# Patient Record
Sex: Female | Born: 1980 | Race: Black or African American | Hispanic: No | Marital: Single | Smoking: Never smoker
Health system: Southern US, Community
[De-identification: ages and names within clinical notes are randomized; demographics above are authoritative.]

## PROBLEM LIST (undated history)

## (undated) ENCOUNTER — Ambulatory Visit (HOSPITAL_COMMUNITY)

## (undated) DIAGNOSIS — J302 Other seasonal allergic rhinitis: Secondary | ICD-10-CM

## (undated) DIAGNOSIS — L0291 Cutaneous abscess, unspecified: Secondary | ICD-10-CM

## (undated) DIAGNOSIS — F419 Anxiety disorder, unspecified: Secondary | ICD-10-CM

## (undated) HISTORY — PX: TUBAL LIGATION: SHX77

---
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
## 1998-01-19 ENCOUNTER — Emergency Department (HOSPITAL_COMMUNITY): Admission: EM | Admit: 1998-01-19 | Discharge: 1998-01-19 | Payer: Self-pay | Admitting: Emergency Medicine

## 1998-02-17 ENCOUNTER — Emergency Department (HOSPITAL_COMMUNITY): Admission: EM | Admit: 1998-02-17 | Discharge: 1998-02-17 | Payer: Self-pay | Admitting: Emergency Medicine

## 1998-02-18 ENCOUNTER — Encounter: Payer: Self-pay | Admitting: Emergency Medicine

## 2004-02-10 ENCOUNTER — Other Ambulatory Visit: Admission: RE | Admit: 2004-02-10 | Discharge: 2004-02-10 | Payer: Self-pay | Admitting: Gynecology

## 2004-04-29 ENCOUNTER — Emergency Department (HOSPITAL_COMMUNITY): Admission: EM | Admit: 2004-04-29 | Discharge: 2004-04-29 | Payer: Self-pay | Admitting: Family Medicine

## 2005-02-27 ENCOUNTER — Emergency Department (HOSPITAL_COMMUNITY): Admission: EM | Admit: 2005-02-27 | Discharge: 2005-02-27 | Payer: Self-pay | Admitting: Emergency Medicine

## 2006-01-12 ENCOUNTER — Emergency Department (HOSPITAL_COMMUNITY): Admission: EM | Admit: 2006-01-12 | Discharge: 2006-01-12 | Payer: Self-pay | Admitting: Family Medicine

## 2006-06-04 ENCOUNTER — Inpatient Hospital Stay (HOSPITAL_COMMUNITY): Admission: AD | Admit: 2006-06-04 | Discharge: 2006-06-04 | Payer: Self-pay | Admitting: *Deleted

## 2006-07-12 ENCOUNTER — Encounter: Admission: RE | Admit: 2006-07-12 | Discharge: 2006-07-12 | Payer: Self-pay | Admitting: Obstetrics and Gynecology

## 2006-09-18 ENCOUNTER — Inpatient Hospital Stay (HOSPITAL_COMMUNITY): Admission: AD | Admit: 2006-09-18 | Discharge: 2006-09-21 | Payer: Self-pay | Admitting: Obstetrics and Gynecology

## 2006-09-20 ENCOUNTER — Encounter (INDEPENDENT_AMBULATORY_CARE_PROVIDER_SITE_OTHER): Payer: Self-pay | Admitting: Obstetrics and Gynecology

## 2007-06-08 ENCOUNTER — Emergency Department (HOSPITAL_COMMUNITY): Admission: EM | Admit: 2007-06-08 | Discharge: 2007-06-08 | Payer: Self-pay | Admitting: Emergency Medicine

## 2007-06-12 ENCOUNTER — Emergency Department (HOSPITAL_COMMUNITY): Admission: EM | Admit: 2007-06-12 | Discharge: 2007-06-12 | Payer: Self-pay | Admitting: Emergency Medicine

## 2007-07-24 ENCOUNTER — Emergency Department (HOSPITAL_COMMUNITY): Admission: EM | Admit: 2007-07-24 | Discharge: 2007-07-24 | Payer: Self-pay | Admitting: Emergency Medicine

## 2007-10-23 ENCOUNTER — Emergency Department (HOSPITAL_COMMUNITY): Admission: EM | Admit: 2007-10-23 | Discharge: 2007-10-23 | Payer: Self-pay | Admitting: Emergency Medicine

## 2007-10-26 ENCOUNTER — Ambulatory Visit: Payer: Self-pay | Admitting: Family Medicine

## 2007-10-26 ENCOUNTER — Encounter (INDEPENDENT_AMBULATORY_CARE_PROVIDER_SITE_OTHER): Payer: Self-pay | Admitting: *Deleted

## 2007-10-26 ENCOUNTER — Encounter: Payer: Self-pay | Admitting: Family Medicine

## 2007-10-26 DIAGNOSIS — F329 Major depressive disorder, single episode, unspecified: Secondary | ICD-10-CM

## 2007-10-26 DIAGNOSIS — F32A Depression, unspecified: Secondary | ICD-10-CM | POA: Insufficient documentation

## 2007-10-26 LAB — CONVERTED CEMR LAB
CO2: 20 meq/L (ref 19–32)
Chloride: 106 meq/L (ref 96–112)
Creatinine, Ser: 0.91 mg/dL (ref 0.40–1.20)
HCT: 41.5 % (ref 36.0–46.0)
Hemoglobin: 12.7 g/dL (ref 12.0–15.0)
Platelets: 189 10*3/uL (ref 150–400)
Potassium: 3.9 meq/L (ref 3.5–5.3)
WBC: 4.4 10*3/uL (ref 4.0–10.5)

## 2008-02-22 ENCOUNTER — Emergency Department (HOSPITAL_COMMUNITY): Admission: EM | Admit: 2008-02-22 | Discharge: 2008-02-22 | Payer: Self-pay | Admitting: Emergency Medicine

## 2008-02-27 ENCOUNTER — Telehealth: Payer: Self-pay | Admitting: *Deleted

## 2008-02-27 ENCOUNTER — Ambulatory Visit: Payer: Self-pay | Admitting: Family Medicine

## 2008-04-14 ENCOUNTER — Emergency Department (HOSPITAL_COMMUNITY): Admission: EM | Admit: 2008-04-14 | Discharge: 2008-04-14 | Payer: Self-pay | Admitting: *Deleted

## 2008-04-15 ENCOUNTER — Ambulatory Visit: Payer: Self-pay | Admitting: Family Medicine

## 2008-06-06 ENCOUNTER — Telehealth: Payer: Self-pay | Admitting: *Deleted

## 2008-06-19 ENCOUNTER — Telehealth: Payer: Self-pay | Admitting: Family Medicine

## 2008-08-26 ENCOUNTER — Emergency Department (HOSPITAL_COMMUNITY): Admission: EM | Admit: 2008-08-26 | Discharge: 2008-08-26 | Payer: Self-pay | Admitting: Emergency Medicine

## 2008-12-18 ENCOUNTER — Telehealth: Payer: Self-pay | Admitting: Family Medicine

## 2008-12-18 ENCOUNTER — Ambulatory Visit: Payer: Self-pay | Admitting: Family Medicine

## 2008-12-30 ENCOUNTER — Telehealth: Payer: Self-pay | Admitting: Family Medicine

## 2009-03-22 ENCOUNTER — Emergency Department (HOSPITAL_COMMUNITY): Admission: EM | Admit: 2009-03-22 | Discharge: 2009-03-22 | Payer: Self-pay | Admitting: Emergency Medicine

## 2009-04-03 ENCOUNTER — Emergency Department (HOSPITAL_COMMUNITY): Admission: EM | Admit: 2009-04-03 | Discharge: 2009-04-03 | Payer: Self-pay | Admitting: Family Medicine

## 2009-06-10 ENCOUNTER — Telehealth: Payer: Self-pay | Admitting: Family Medicine

## 2009-06-23 ENCOUNTER — Ambulatory Visit: Payer: Self-pay | Admitting: Family Medicine

## 2009-06-23 LAB — CONVERTED CEMR LAB
Alkaline Phosphatase: 48 units/L (ref 39–117)
BUN: 6 mg/dL (ref 6–23)
CO2: 23 meq/L (ref 19–32)
Creatinine, Ser: 0.67 mg/dL (ref 0.40–1.20)
Glucose, Bld: 127 mg/dL — ABNORMAL HIGH (ref 70–99)
HCT: 38.9 % (ref 36.0–46.0)
MCHC: 30.8 g/dL (ref 30.0–36.0)
MCV: 74.5 fL — ABNORMAL LOW (ref 78.0–100.0)
RBC: 5.22 M/uL — ABNORMAL HIGH (ref 3.87–5.11)
Sodium: 139 meq/L (ref 135–145)
TSH: 0.502 microintl units/mL (ref 0.350–4.500)
Total Bilirubin: 0.6 mg/dL (ref 0.3–1.2)
Total Protein: 6.7 g/dL (ref 6.0–8.3)

## 2009-07-03 ENCOUNTER — Encounter: Payer: Self-pay | Admitting: Family Medicine

## 2010-03-03 NOTE — Progress Notes (Signed)
Summary: phn msg  Phone Note Call from Patient Call back at [facility] Phone [phone]   Caller: Patient Summary of Call: Wants to talk to Dr. about antidepressants. Initial call taken by: Clydell Hakim,  Jun 10, 2009 3:23 PM  Follow-up for Phone Call        Offer her an appointment can see tomorrow moring or next week thanks [facility] Follow-up by: Pearlean Brownie MD,  Jun 11, 2009 8:53 AM  Additional Follow-up for Phone Call Additional follow up Details #1::        Attempted to call but no answer. Additional Follow-up by: Jone Baseman CMA,  Jun 11, 2009 9:29 AM    Additional Follow-up for Phone Call Additional follow up Details #2::    Pls try to contact her again thanks [facility] Follow-up by: Pearlean Brownie MD,  Jun 13, 2009 8:48 AM  Additional Follow-up for Phone Call Additional follow up Details #3:: Details for Additional Follow-up Action Taken: Attempted to call again.  No answer.  Will await callback Additional Follow-up by: Jone Baseman CMA,  Jun 13, 2009 11:21 AM

## 2010-03-03 NOTE — Miscellaneous (Signed)
  Clinical Lists Changes  Called [phone] - message not available - not able to leave message Pearlean Brownie MD  July 03, 2009 3:10 PM  pt returned call and cannot take calls at work.  she gets off at 4:30 so pls call back after that time. De Nurse  July 04, 2009 3:10 PM  Called Talked with her - is seeing counselor and feeling some better and taking her medications without problems.  Asked for her to come in in a few weeks or if worsening Pearlean Brownie MD  July 04, 2009 5:11 PM

## 2010-03-03 NOTE — Assessment & Plan Note (Signed)
Summary: meds/kh   Vital Signs:  Patient profile:   30 year old female Height:      64 inches Weight:      202.8 pounds BMI:     34.94 Temp:     99.0 degrees F oral Pulse rate:   77 / minute BP sitting:   112 / 79  (left arm) Cuff size:   regular  Vitals Entered By: Garen Grams LPN (Jun 23, 2009 10:35 AM) CC: f/u meds Is Patient Diabetic? No Pain Assessment Patient in pain? no        Primary Care Provider:  Pearlean Brownie MD  CC:  f/u meds.  History of Present Illness: DEPRESSION (ICD-311) feeling very down for the last few months.  Many stressors particularly with her sons fecal incontinence and taking care of her sisters child. Missing work to care for son and having difficulty concentrating.  Crying frequently.  Not much appetite and disordered sleep.  No suicidal ideation or plans but does not feel like she is living.  Not on sertraline for months since did not feel it helped.  No weight loss or fever or focal pain other than migraine headaches Has made an appointment to see a counselor this coming Thursday Does smoke marijuana intermittently no alcohol  Father had history of depression  ROS - as above PMH - Medications reviewed and updated in medication list.  Smoking Status noted in VS form      Habits & Providers  Alcohol-Tobacco-Diet     Tobacco Status: never  Current Medications (verified): 1)  Paxil 20 Mg Tabs (Paroxetine Hcl) .Marland Kitchen.. 1 By Mouth Daily  Social History: Raj Janus daughter 76; Derrick son 67; Tyleek son 68, Antinaysha daughter 37;  Also caring for Niece  Works as Clinical biochemist rep at [employer] No hobbies  Physical Exam  General:  Frequent crying.  Attentive and appropriate with her son Ladene Artist Psych:  Cognition and judgment appear intact. Alert and cooperative with normal attention span and concentration. No apparent delusions, illusions, hallucinations   Impression & Recommendations:  Problem # 1:  DEPRESSION (ICD-311) Assessment  Deteriorated Most likely depression but with significant fatigue will check labs. Is in a difficult situation with children.  She has made an appointment to see a counselor.  Will start Paxil and follow closely  The following medications were removed from the medication list:    Sertraline Hcl 50 Mg Tabs (Sertraline hcl) .Marland Kitchen... 1 by mouth daily Her updated medication list for this problem includes:    Paxil 20 Mg Tabs (Paroxetine hcl) .Marland Kitchen... 1 by mouth daily  Orders: Comp Met-[facility] [phone]) TSH-[facility] (09811-91478) CBC-[facility] (29562) [facility]- Est  Level 4 (13086)  Complete Medication List: 1)  Paxil 20 Mg Tabs (Paroxetine hcl) .Marland Kitchen.. 1 by mouth daily  Patient Instructions: 1)  Please schedule a follow-up appointment in 1-2 weeks.  2)  Keep your appointment with your counselor 3)  Call if you are feeling worse 4)  I will call you if your lab is abnormal otherwise I will discuss the results during our next visit 5)  Call me if your work needs information or paper work Prescriptions: PAXIL 20 MG TABS (PAROXETINE HCL) 1 by mouth daily  #30 x 1   Entered and Authorized by:   Pearlean Brownie MD   Signed by:   Pearlean Brownie MD on 06/23/2009   Method used:   Electronically to        [facility]  [address] [phone]* (retail)  [address]       [place], [place]  [id number]       Ph: [phone] or [phone]       Fax: [phone]   RxID:   [id number]    Orders Added: 1)  Comp Met-[facility] [16606-30160] 2)  TSH-[facility] [[id number]] 3)  CBC-[facility] [85027] 4)  [facility]- Est  Level 4 [22025]

## 2010-03-05 ENCOUNTER — Encounter: Payer: Self-pay | Admitting: *Deleted

## 2010-04-07 ENCOUNTER — Emergency Department (HOSPITAL_COMMUNITY)
Admission: EM | Admit: 2010-04-07 | Discharge: 2010-04-07 | Disposition: A | Discharge status: Home / Self Care | Payer: BC Managed Care – PPO | Attending: Emergency Medicine | Admitting: Emergency Medicine

## 2010-04-07 DIAGNOSIS — K5289 Other specified noninfective gastroenteritis and colitis: Secondary | ICD-10-CM | POA: Insufficient documentation

## 2010-04-07 DIAGNOSIS — R112 Nausea with vomiting, unspecified: Secondary | ICD-10-CM | POA: Insufficient documentation

## 2010-04-07 DIAGNOSIS — R197 Diarrhea, unspecified: Secondary | ICD-10-CM | POA: Insufficient documentation

## 2010-04-26 LAB — POCT URINALYSIS DIP (DEVICE)
Bilirubin Urine: NEGATIVE
Glucose, UA: NEGATIVE mg/dL
Hgb urine dipstick: NEGATIVE
Ketones, ur: NEGATIVE mg/dL
Specific Gravity, Urine: 1.015 (ref 1.005–1.030)

## 2010-04-26 LAB — POCT PREGNANCY, URINE: Preg Test, Ur: NEGATIVE

## 2010-04-26 LAB — GC/CHLAMYDIA PROBE AMP, GENITAL
Chlamydia, DNA Probe: NEGATIVE
GC Probe Amp, Genital: NEGATIVE

## 2010-05-10 LAB — POCT URINALYSIS DIP (DEVICE)
Nitrite: POSITIVE — AB
pH: 7 (ref 5.0–8.0)

## 2010-05-25 ENCOUNTER — Ambulatory Visit (INDEPENDENT_AMBULATORY_CARE_PROVIDER_SITE_OTHER): Payer: BC Managed Care – PPO | Admitting: Family Medicine

## 2010-05-25 ENCOUNTER — Other Ambulatory Visit (HOSPITAL_COMMUNITY)
Admission: RE | Admit: 2010-05-25 | Discharge: 2010-05-25 | Disposition: A | Discharge status: Home / Self Care | Payer: BC Managed Care – PPO | Source: Ambulatory Visit | Attending: Family Medicine | Admitting: Family Medicine

## 2010-05-25 ENCOUNTER — Encounter: Payer: Self-pay | Admitting: Family Medicine

## 2010-05-25 VITALS — BP 150/81 | HR 105 | Temp 98.8°F | Ht 65.0 in | Wt 204.6 lb

## 2010-05-25 DIAGNOSIS — F329 Major depressive disorder, single episode, unspecified: Secondary | ICD-10-CM

## 2010-05-25 DIAGNOSIS — N949 Unspecified condition associated with female genital organs and menstrual cycle: Secondary | ICD-10-CM

## 2010-05-25 DIAGNOSIS — Z13220 Encounter for screening for lipoid disorders: Secondary | ICD-10-CM

## 2010-05-25 DIAGNOSIS — N644 Mastodynia: Secondary | ICD-10-CM | POA: Insufficient documentation

## 2010-05-25 DIAGNOSIS — R102 Pelvic and perineal pain: Secondary | ICD-10-CM

## 2010-05-25 DIAGNOSIS — F3289 Other specified depressive episodes: Secondary | ICD-10-CM

## 2010-05-25 DIAGNOSIS — Z124 Encounter for screening for malignant neoplasm of cervix: Secondary | ICD-10-CM

## 2010-05-25 DIAGNOSIS — Z01419 Encounter for gynecological examination (general) (routine) without abnormal findings: Secondary | ICD-10-CM | POA: Insufficient documentation

## 2010-05-25 LAB — POCT URINALYSIS DIPSTICK
Bilirubin, UA: NEGATIVE
Glucose, UA: NEGATIVE
Leukocytes, UA: NEGATIVE
Nitrite, UA: NEGATIVE
Urobilinogen, UA: 1
pH, UA: 6

## 2010-05-25 LAB — POCT UA - MICROSCOPIC ONLY

## 2010-05-25 NOTE — Patient Instructions (Addendum)
Keep up the exercise that is great. Controlling your weight is your major goal.  Review your diet to see what high calorie foods you eat or drink  Come in fasting - no food or drink for 8 hours and we will check your labs and cholesterol I will call you if your tests are not good.  Otherwise I will send you a letter.  If you do not hear from me with in 2 weeks please call our office.

## 2010-05-25 NOTE — Assessment & Plan Note (Signed)
Under control off of medication

## 2010-05-25 NOTE — Assessment & Plan Note (Signed)
Associated with diffuse pelvic pain and back pain.  Exam and UA and Upreg was normal today.  Given her weight gain and nonspecific symptoms and strong family history for diabetes mellitus will check bmet and tsh

## 2010-05-25 NOTE — Progress Notes (Signed)
  Subjective:     Sara Brock is a 30 y.o. female and is here for a comprehensive physical exam. The patient reports problems - Feeling achy in both breast and pelvic discomfort for last few months.  Also intermittent left sided low back pain.   Review of Systems Patient reports no vision/ hearing  changes, adenopathy,fever, weight change,  persistant / recurrent hoarseness , swallowing issues, chest pain,palpitations,edema,persistant /recurrent cough, hemoptysis, dyspnea( rest/ exertional/paroxysmal nocturnal), gastrointestinal bleeding(melena, rectal bleeding), abdominal pain, significant heartburn boel changes,GU symptoms(dysuria, hematuria,pyuria, incontinence) ), Gyn symptoms(abnormal  bleeding , pain),  syncope, focal weakness, memory loss,numbness & tingling, skin/hair /nail changes,abnormal bruising or bleeding, anxiety,or depression.   Objective:    Neck:  No deformities, thyromegaly, masses, or tenderness noted.   Supple with full range of motion without pain. Heart - Regular rate and rhythm.  No murmurs, gallops or rubs.    Lungs:  Normal respiratory effort, chest expands symmetrically. Lungs are clear to auscultation, no crackles or wheezes. Abdomen: soft and non-tender without masses, organomegaly or hernias noted.  No guarding or rebound Breasts: breasts appear normal, no suspicious masses, no skin or nipple changes or axillary nodes Genitalia:  Normal introitus for age, no external lesions, no vaginal discharge, mucosa pink and moist, no vaginal or cervical lesions, no vaginal atrophy, no friaility or hemorrhage, normal uterus size and position, no adnexal masses or tenderness Back - nontender no deformity  UA noted   Assessment:    Healthy female exam.    Plan:     See After Visit Summary for Counseling Recommendations

## 2010-06-07 ENCOUNTER — Emergency Department (HOSPITAL_COMMUNITY)
Admission: EM | Admit: 2010-06-07 | Discharge: 2010-06-07 | Disposition: A | Discharge status: Home / Self Care | Payer: BC Managed Care – PPO | Attending: Emergency Medicine | Admitting: Emergency Medicine

## 2010-06-07 ENCOUNTER — Emergency Department (HOSPITAL_COMMUNITY): Payer: BC Managed Care – PPO

## 2010-06-07 DIAGNOSIS — R296 Repeated falls: Secondary | ICD-10-CM | POA: Insufficient documentation

## 2010-06-07 DIAGNOSIS — M25569 Pain in unspecified knee: Secondary | ICD-10-CM | POA: Insufficient documentation

## 2010-06-07 DIAGNOSIS — S8000XA Contusion of unspecified knee, initial encounter: Secondary | ICD-10-CM | POA: Insufficient documentation

## 2010-06-19 NOTE — Op Note (Signed)
NAMEKYLIANA, Sara Brock           ACCOUNT NO.:  [id number]   MEDICAL RECORD NO.:  [id number]          PATIENT TYPE:  INP   LOCATION:  9125                          FACILITY:  [facility]   PHYSICIAN:  Maxie Better, M.D.DATE OF BIRTH:  01-30-1981   DATE OF PROCEDURE:  09/20/2006  DATE OF DISCHARGE:                               OPERATIVE REPORT   PREOPERATIVE DIAGNOSIS:  Desires sterilization, status post vaginal  delivery on September 19, 2006, and multiparity.   PROCEDURE:  Modified P[place]oy postpartum tubal ligation.   POSTOPERATIVE DIAGNOSIS:  Desires sterilization, multiparity, status  post vaginal delivery on September 19, 2006.   ANESTHESIA:  General.   SURGEON:  Maxie Better, M.D.   INDICATION:  25-year gravida 4, para 4 female status post uncomplicated  vaginal delivery on September 19, 2006, desires permanent sterilization.  Risk and benefit of this procedure explained to the patient.  Consent  was signed and the patient was transferred to the operating room.   PROCEDURE:  Under adequate general anesthesia the patient is placed in  the supine position.  She was sterilely prepped and draped in usual  fashion.  Quarter percent Marcaine was injected over the infraumbilical  and incision was then made, carried down to the rectus fascia.  Rectus  fascia was opened transversely and the parietoperitoneum was also  opened.  Pursestring 0 Vicryl was placed along fascia.  The fallopian  tube was bilaterally identified down to the fimbriated end after much  manipulation due to the omentum present.  The midportion of both  fallopian tube was grasped with a Babcock.  The underlying mesosalpinx  was opened with cautery. Proximal and distal portion of both tube was  then tied with 0 chromic suture x2 proximally and distally and the  intervening segment of tube was removed bilaterally.  The good  hemostasis noted.  The pursestring was then closed the fascia.  The skin  was approximated  using subcuticular 4-0 Vicryl suture.  Specimen labeled  portion of right left fallopian tube was sent to pathology.  Estimated  blood loss was minimal.  Complication was none.  The patient tolerated  the procedure well and was transferred from in stable condition.      Maxie Better, M.D.  Electronically Signed     [place]/MEDQ  D:  09/20/2006  T:  09/20/2006  Job:  [id number]

## 2010-11-13 LAB — CBC
HCT: 30.1 — ABNORMAL LOW
Hemoglobin: 10.1 — ABNORMAL LOW
Hemoglobin: 9.1 — ABNORMAL LOW
MCHC: 32.9
MCV: 72.6 — ABNORMAL LOW
MCV: 73.7 — ABNORMAL LOW
RBC: 3.75 — ABNORMAL LOW
RDW: 16.1 — ABNORMAL HIGH
WBC: 7.9

## 2011-06-22 ENCOUNTER — Emergency Department (HOSPITAL_COMMUNITY)
Admission: EM | Admit: 2011-06-22 | Discharge: 2011-06-23 | Disposition: A | Discharge status: Home / Self Care | Payer: Medicaid Other | Attending: Emergency Medicine | Admitting: Emergency Medicine

## 2011-06-22 ENCOUNTER — Emergency Department (HOSPITAL_COMMUNITY): Payer: Medicaid Other

## 2011-06-22 ENCOUNTER — Encounter (HOSPITAL_COMMUNITY): Payer: Self-pay | Admitting: Emergency Medicine

## 2011-06-22 DIAGNOSIS — R6889 Other general symptoms and signs: Secondary | ICD-10-CM | POA: Insufficient documentation

## 2011-06-22 DIAGNOSIS — R05 Cough: Secondary | ICD-10-CM | POA: Insufficient documentation

## 2011-06-22 DIAGNOSIS — R0602 Shortness of breath: Secondary | ICD-10-CM | POA: Insufficient documentation

## 2011-06-22 DIAGNOSIS — J069 Acute upper respiratory infection, unspecified: Secondary | ICD-10-CM

## 2011-06-22 DIAGNOSIS — J3489 Other specified disorders of nose and nasal sinuses: Secondary | ICD-10-CM | POA: Insufficient documentation

## 2011-06-22 DIAGNOSIS — J029 Acute pharyngitis, unspecified: Secondary | ICD-10-CM

## 2011-06-22 DIAGNOSIS — R059 Cough, unspecified: Secondary | ICD-10-CM | POA: Insufficient documentation

## 2011-06-22 MED ORDER — PENICILLIN G BENZATHINE 1200000 UNIT/2ML IM SUSP
1.2000 10*6.[IU] | Freq: Once | INTRAMUSCULAR | Status: AC
  Administered 2011-06-23: 1.2 10*6.[IU] via INTRAMUSCULAR
  Filled 2011-06-22: qty 2

## 2011-06-22 MED ORDER — ALBUTEROL SULFATE (5 MG/ML) 0.5% IN NEBU
5.0000 mg | INHALATION_SOLUTION | Freq: Once | RESPIRATORY_TRACT | Status: AC
  Administered 2011-06-22: 5 mg via RESPIRATORY_TRACT

## 2011-06-22 MED ORDER — ALBUTEROL SULFATE (5 MG/ML) 0.5% IN NEBU
INHALATION_SOLUTION | RESPIRATORY_TRACT | Status: AC
  Filled 2011-06-22: qty 1

## 2011-06-22 NOTE — ED Provider Notes (Signed)
History     CSN: [id number]  Arrival date & time 06/22/11  2032   First MD Initiated Contact with Patient 06/22/11 2335      Chief Complaint  Patient presents with  . Shortness of Breath    (Consider location/radiation/quality/duration/timing/severity/associated sxs/prior treatment) HPI Comments: Patient is an emergency department with chief complaint of sore throat, runny nose, and shortness of breath.  Onset of symptoms began 2 days ago.  Patient denies any fevers, night sweats, chills, trismus, vision change, disequilibrium, chest tightness, chest pain, claudication orthopnea, dyspnea on exertion, history of asthma, or sick contacts.  Patient has not taken anything for her nasal congestion.  Patient is a 31 y.o. female presenting with shortness of breath. The history is provided by the patient.  Shortness of Breath  Associated symptoms include rhinorrhea, sore throat, cough and shortness of breath. Pertinent negatives include no chest pain, no fever, no stridor and no wheezing.    History reviewed. No pertinent past medical history.  Past Surgical History  Procedure Date  . Tubal ligation     No family history on file.  History  Substance Use Topics  . Smoking status: Never Smoker   . Smokeless tobacco: Not on file  . Alcohol Use: No    OB History    Grav Para Term Preterm Abortions TAB SAB Ect Mult Living                  Review of Systems  Constitutional: Negative for fever, chills, appetite change and fatigue.  HENT: Positive for congestion, sore throat and rhinorrhea. Negative for hearing loss, ear pain, nosebleeds, sneezing, trouble swallowing, neck stiffness, voice change, postnasal drip, sinus pressure, tinnitus and ear discharge.   Eyes: Negative for photophobia and visual disturbance.  Respiratory: Positive for cough and shortness of breath. Negative for apnea, choking, chest tightness, wheezing and stridor.   Cardiovascular: Negative for chest pain,  palpitations and leg swelling.  Gastrointestinal: Negative for nausea, vomiting, abdominal pain, diarrhea and constipation.  Genitourinary: Negative for dysuria, urgency and flank pain.  Musculoskeletal: Negative for myalgias.  Neurological: Negative for dizziness, seizures, syncope, weakness, light-headedness, numbness and headaches.  Psychiatric/Behavioral: Negative for behavioral problems and confusion.  All other systems reviewed and are negative.    Allergies  Review of patient's allergies indicates no known allergies.  Home Medications   Current Outpatient Rx  Name Route Sig Dispense Refill  . ALKA-SELTZER PLS SINUS & COUGH PO Oral Take 2 capsules by mouth daily.    Marland Kitchen NAPROXEN SODIUM 220 MG PO TABS Oral Take 220 mg by mouth daily as needed. For pain.      BP 122/79  Pulse 86  Resp 18  SpO2 98%  LMP 06/17/2011  Physical Exam  Nursing note and vitals reviewed. Constitutional: She is oriented to person, place, and time. She appears well-developed and well-nourished. No distress.  HENT:  Head: Normocephalic and atraumatic. No trismus in the jaw.  Right Ear: Tympanic membrane, external ear and ear canal normal.  Left Ear: Tympanic membrane, external ear and ear canal normal.  Nose: Nose normal. No rhinorrhea. Right sinus exhibits no maxillary sinus tenderness and no frontal sinus tenderness. Left sinus exhibits no maxillary sinus tenderness and no frontal sinus tenderness.  Mouth/Throat: Uvula is midline and mucous membranes are normal. Normal dentition. No dental abscesses or uvula swelling. Oropharyngeal exudate and posterior oropharyngeal edema present. No posterior oropharyngeal erythema or tonsillar abscesses.       No submental edema, tongue not  elevated, no trismus. No impending airway obstruction; Pt able to speak full sentences, swallow intact, no drooling, stridor, or tonsillar/uvula displacement. No palatal petechia  Eyes: Conjunctivae are normal.  Neck: Trachea  normal, normal range of motion and full passive range of motion without pain. Neck supple. No rigidity. Erythema present. Normal range of motion present. No Brudzinski's sign noted.       Flexion and extension of neck without pain or difficulty. Able to breath without difficulty in extension.  Cardiovascular: Normal rate and regular rhythm.   Pulmonary/Chest: Effort normal and breath sounds normal. No stridor. No respiratory distress. She has no wheezes.  Abdominal: Soft. There is no tenderness.       No obvious evidence of splenomegaly. Non ttp.   Musculoskeletal: Normal range of motion.  Lymphadenopathy:       Head (right side): No preauricular and no posterior auricular adenopathy present.       Head (left side): No preauricular and no posterior auricular adenopathy present.    She has cervical adenopathy.  Neurological: She is alert and oriented to person, place, and time.  Skin: Skin is warm and dry. No rash noted. She is not diaphoretic.  Psychiatric: She has a normal mood and affect.    ED Course  Procedures (including critical care time)  Labs Reviewed - No data to display Dg Chest 2 View  06/22/2011  *RADIOLOGY REPORT*  Clinical Data: Cough.  Mid chest pain.  Headache.  CHEST - 2 VIEW  Comparison: Two-view chest x-ray 02/22/2008, 06/12/1007.  Findings: Cardiomediastinal silhouette unremarkable, unchanged. Lungs clear.  Bronchovascular markings normal.  Pulmonary vascularity normal.  No pneumothorax.  No pleural effusions. Visualized bony thorax intact.  No significant interval change.  IMPRESSION: Normal and stable examination.  Original Report Authenticated By: Arnell Sieving, M.D.     No diagnosis found.    MDM  Pharyngitis, question strep vs URI   Pt CXR negative for acute infiltrate. Patients symptoms are consistent with URI, likely viral etiology. Discussed that antibiotics are not indicated for viral infections. Treated in ED w PCN shot for possible strep. No  evidence of infection spread, uvula midlinePt will be discharged with symptomatic treatment.  Verbalizes understanding and is agreeable with plan. Pt is hemodynamically stable & in NAD prior to dc.         Jaci Carrel, [place] 06/23/11 0004

## 2011-06-22 NOTE — ED Notes (Signed)
Presents with SOB that began today after sore throat and nasal congestion that began Friday. Left breath sounds with expiratory wheezes, c/o pain with inspiration. Sats 95% RA

## 2011-06-22 NOTE — ED Notes (Signed)
PT. REPORTS SOB WITH DRY COUGH , RUNNY NOSE /NASAL CONGESTION AND HEADACHE/SORE THROAT ONSET 2 DAYS AGO.  DENIES FEVER OR CHILLS.

## 2011-06-23 ENCOUNTER — Encounter (HOSPITAL_COMMUNITY): Payer: Self-pay | Admitting: *Deleted

## 2011-06-23 ENCOUNTER — Emergency Department (HOSPITAL_COMMUNITY)
Admission: EM | Admit: 2011-06-23 | Discharge: 2011-06-23 | Disposition: A | Discharge status: Home / Self Care | Payer: Medicaid Other | Source: Home / Self Care | Attending: Family Medicine | Admitting: Family Medicine

## 2011-06-23 DIAGNOSIS — J45901 Unspecified asthma with (acute) exacerbation: Secondary | ICD-10-CM

## 2011-06-23 MED ORDER — METHYLPREDNISOLONE 4 MG PO KIT: PACK | ORAL | Status: AC

## 2011-06-23 MED ORDER — ALBUTEROL SULFATE HFA 108 (90 BASE) MCG/ACT IN AERS: 1.0000 | INHALATION_SPRAY | Freq: Four times a day (QID) | RESPIRATORY_TRACT | Status: DC | PRN

## 2011-06-23 MED ORDER — METHYLPREDNISOLONE SODIUM SUCC 125 MG IJ SOLR
INTRAMUSCULAR | Status: AC
  Filled 2011-06-23: qty 2

## 2011-06-23 MED ORDER — ALBUTEROL SULFATE (5 MG/ML) 0.5% IN NEBU
INHALATION_SOLUTION | RESPIRATORY_TRACT | Status: AC
  Filled 2011-06-23: qty 1

## 2011-06-23 MED ORDER — METHYLPREDNISOLONE SODIUM SUCC 125 MG IJ SOLR
125.0000 mg | Freq: Once | INTRAMUSCULAR | Status: AC
  Administered 2011-06-23: 125 mg via INTRAMUSCULAR

## 2011-06-23 MED ORDER — MOMETASONE FUROATE 50 MCG/ACT NA SUSP: 2.0000 | Freq: Every day | NASAL | Status: DC

## 2011-06-23 MED ORDER — IPRATROPIUM BROMIDE 0.02 % IN SOLN
0.5000 mg | Freq: Once | RESPIRATORY_TRACT | Status: AC
  Administered 2011-06-23: 0.5 mg via RESPIRATORY_TRACT

## 2011-06-23 MED ORDER — IBUPROFEN 200 MG PO TABS
600.0000 mg | ORAL_TABLET | Freq: Once | ORAL | Status: AC
  Administered 2011-06-23: 600 mg via ORAL
  Filled 2011-06-23: qty 3

## 2011-06-23 MED ORDER — ALBUTEROL SULFATE (5 MG/ML) 0.5% IN NEBU
5.0000 mg | INHALATION_SOLUTION | Freq: Once | RESPIRATORY_TRACT | Status: AC
  Administered 2011-06-23: 5 mg via RESPIRATORY_TRACT

## 2011-06-23 NOTE — ED Notes (Signed)
Pt   Was  Seen    Er  Last  Pm  And  Was  Given    Penicillin  Injection  And        A  Breathing  tx       She  Reports  She  Was  Told  She  Had  URI         SHE  REPORTS  HER  SYMPTOMS       CONTINUE  SHE  HAS  SHORTNESS OF  BREATH AND  PAIN IN  CHEST     WHEN  SHE        SHE  MOVES   AND  EXERTS  HERSELF   SHE IS  AWAKE  AND  ALERT   SOMEWHAT  ANXIOUS

## 2011-06-23 NOTE — ED Provider Notes (Signed)
Medical screening examination/treatment/procedure(s) were performed by non-physician practitioner and as supervising physician I was immediately available for consultation/collaboration.  Ethelda Chick, MD 06/23/11 [phone]

## 2011-06-23 NOTE — ED Notes (Signed)
Called  To  Waiting  Room  To  evaul  31  Yr  Old  -  Seen er  Last  Pm  For  Glenford Peers  /throat  Infection   Pt  Congested  With stuffy  Nose   Pt  Reports  She  Had  Chest pain last pm and  Continues  Today   BP 124/86  Pulse  Ox  97  Pulse  102  resps  20

## 2011-06-23 NOTE — ED Provider Notes (Signed)
History     CSN: [id number]  Arrival date & time 06/23/11  1731   First MD Initiated Contact with Patient 06/23/11 1732      Chief Complaint  Patient presents with  . Shortness of Breath    (Consider location/radiation/quality/duration/timing/severity/associated sxs/prior treatment) Patient is a 31 y.o. female presenting with shortness of breath. The history is provided by the patient.  Shortness of Breath  The current episode started 3 to 5 days ago (seen in ER last eve, given pcn ). The onset was gradual. The problem has been gradually worsening (sx worse today with sob and doe.). The problem is moderate. Associated symptoms include cough and shortness of breath.    History reviewed. No pertinent past medical history.  Past Surgical History  Procedure Date  . Tubal ligation     History reviewed. No pertinent family history.  History  Substance Use Topics  . Smoking status: Never Smoker   . Smokeless tobacco: Not on file  . Alcohol Use: No    OB History    Grav Para Term Preterm Abortions TAB SAB Ect Mult Living                  Review of Systems  Constitutional: Negative.   HENT: Negative.   Respiratory: Positive for cough and shortness of breath.   Cardiovascular: Negative.   Gastrointestinal: Negative.   Skin: Negative.     Allergies  Review of patient's allergies indicates no known allergies.  Home Medications   Current Outpatient Rx  Name Route Sig Dispense Refill  . ALBUTEROL SULFATE HFA 108 (90 BASE) MCG/ACT IN AERS Inhalation Inhale 1-2 puffs into the lungs every 6 (six) hours as needed for wheezing. 1 Inhaler 0  . ALKA-SELTZER PLS SINUS & COUGH PO Oral Take 2 capsules by mouth daily.    . METHYLPREDNISOLONE 4 MG PO KIT  follow package directions, start on thurs until finished 21 tablet 0  . MOMETASONE FUROATE 50 MCG/ACT NA SUSP Nasal Place 2 sprays into the nose daily. 17 g 12  . NAPROXEN SODIUM 220 MG PO TABS Oral Take 220 mg by mouth daily as  needed. For pain.      BP 127/80  Pulse 96  Temp(Src) 98.4 F (36.9 C) (Oral)  Resp 20  SpO2 100%  LMP 06/17/2011  Physical Exam  Nursing note and vitals reviewed. Constitutional: She appears well-developed and well-nourished.  HENT:  Head: Normocephalic.  Right Ear: External ear normal.  Left Ear: External ear normal.  Mouth/Throat: Oropharynx is clear and moist.  Eyes: Conjunctivae are normal. Pupils are equal, round, and reactive to light.  Neck: Normal range of motion. Neck supple.  Cardiovascular: Normal rate, regular rhythm, normal heart sounds and intact distal pulses.   Pulmonary/Chest: She has wheezes.  Lymphadenopathy:    She has no cervical adenopathy.  Skin: Skin is warm and dry.    ED Course  Procedures (including critical care time)  Labs Reviewed - No data to display Dg Chest 2 View  06/22/2011  *RADIOLOGY REPORT*  Clinical Data: Cough.  Mid chest pain.  Headache.  CHEST - 2 VIEW  Comparison: Two-view chest x-ray 02/22/2008, 06/12/1007.  Findings: Cardiomediastinal silhouette unremarkable, unchanged. Lungs clear.  Bronchovascular markings normal.  Pulmonary vascularity normal.  No pneumothorax.  No pleural effusions. Visualized bony thorax intact.  No significant interval change.  IMPRESSION: Normal and stable examination.  Original Report Authenticated By: Arnell Sieving, M.D.     1. Asthma attack  MDM  Peak flow pre--160,  Post neb 260 Lungs improved wheezes--only rare.        Linna Hoff, MD 06/23/11 2018

## 2011-06-23 NOTE — Discharge Instructions (Signed)
Take all of medicine, drink lots of fluids, no more smoking, see your doctor if further problems  °

## 2011-06-23 NOTE — ED Notes (Signed)
PEAK    FLO  AFTER  BREATHING RX   180

## 2011-06-23 NOTE — Discharge Instructions (Signed)

## 2011-06-23 NOTE — ED Notes (Signed)
Pt notified of what she is waiting on. MD discharging pt home pt aware

## 2011-10-12 ENCOUNTER — Emergency Department (HOSPITAL_COMMUNITY)
Admission: EM | Admit: 2011-10-12 | Discharge: 2011-10-12 | Disposition: A | Discharge status: Home / Self Care | Payer: Medicaid Other | Attending: Emergency Medicine | Admitting: Emergency Medicine

## 2011-10-12 ENCOUNTER — Encounter (HOSPITAL_COMMUNITY): Payer: Self-pay

## 2011-10-12 DIAGNOSIS — L0231 Cutaneous abscess of buttock: Secondary | ICD-10-CM | POA: Insufficient documentation

## 2011-10-12 DIAGNOSIS — L02212 Cutaneous abscess of back [any part, except buttock]: Secondary | ICD-10-CM

## 2011-10-12 HISTORY — DX: Cutaneous abscess, unspecified: L02.91

## 2011-10-12 MED ORDER — HYDROCODONE-ACETAMINOPHEN 5-325 MG PO TABS: 1.0000 | ORAL_TABLET | ORAL | Status: AC | PRN

## 2011-10-12 NOTE — ED Notes (Signed)
pt reports a "boil" between her "cheeks" x4 days. Pt reports hx of the same, states :it's been years since my last one."

## 2011-10-12 NOTE — ED Provider Notes (Signed)
History     CSN: [id number]  Arrival date & time 10/12/11  1954   First MD Initiated Contact with Patient 10/12/11 2221      Chief Complaint  Patient presents with  . Abscess    (Consider location/radiation/quality/duration/timing/severity/associated sxs/prior treatment) HPI History provided by pt.   Pt presents w/ abscess of left medial buttock x 4 days.  Pain has gradually worsened and is unbearable.  Has applied warm compresses but no drainage or relief of pain.  No associated fever.  Has not had a BM in 4 days.    Past Medical History  Diagnosis Date  . Abscess     Past Surgical History  Procedure Date  . Tubal ligation     History reviewed. No pertinent family history.  History  Substance Use Topics  . Smoking status: Never Smoker   . Smokeless tobacco: Not on file  . Alcohol Use: No    OB History    Grav Para Term Preterm Abortions TAB SAB Ect Mult Living                  Review of Systems  All other systems reviewed and are negative.    Allergies  Review of patient's allergies indicates no known allergies.  Home Medications   Current Outpatient Rx  Name Route Sig Dispense Refill  . HYDROCODONE-ACETAMINOPHEN 5-325 MG PO TABS Oral Take 1 tablet by mouth every 4 (four) hours as needed for pain. 12 tablet 0    BP 128/86  Temp 98.3 F (36.8 C) (Oral)  Resp 18  SpO2 97%  LMP 09/16/2011  Physical Exam  Nursing note and vitals reviewed. Constitutional: She is oriented to person, place, and time. She appears well-developed and well-nourished. No distress.  HENT:  Head: Normocephalic and atraumatic.  Eyes:       Normal appearance  Neck: Normal range of motion.  Pulmonary/Chest: Effort normal.  Genitourinary:       Nml rectal tone.  Non-tender.  No palpable fistula.    Musculoskeletal: Normal range of motion.  Neurological: She is alert and oriented to person, place, and time.  Skin:       2.5cm fluctuant boil left medial buttock.  No  surrounding erythema or induration.  Ttp.   Psychiatric: She has a normal mood and affect. Her behavior is normal.    ED Course  Procedures (including critical care time) INCISION AND DRAINAGE Performed by: Otilio Miu Consent: Verbal consent obtained. Risks and benefits: risks, benefits and alternatives were discussed Type: abscess  Body area: L buttock  Anesthesia: local infiltration  Local anesthetic: lidocaine 2% w/ epinephrine  Anesthetic total: 4 ml  Complexity: complex Blunt dissection to break up loculations  Drainage: purulent  Drainage amount: large  Packing material: 1/4 in iodoform gauze  Patient tolerance: Patient tolerated the procedure well with no immediate complications.    Labs Reviewed - No data to display No results found.   1. Cutaneous abscess of back excluding buttocks       MDM  Pt presents w/ abscess left medial buttock.  No surrounding cellulitis.  Does not appear to involve rectum.  I&D'd and pt d/c'd home w/ pain medication.  She has a PCP to f/u with in 2 days.  She will return if she has signs of spreading infection or difficulty having a BM.         Otilio Miu, [place] 10/12/11 2257

## 2011-10-12 NOTE — ED Notes (Signed)
Pt. Reports "blister between my butt cheek". Pain 10/10. States hx of same.

## 2011-10-12 NOTE — ED Notes (Signed)
Prescription given with discharge instructions.  

## 2011-10-14 ENCOUNTER — Emergency Department (HOSPITAL_COMMUNITY)
Admission: EM | Admit: 2011-10-14 | Discharge: 2011-10-14 | Disposition: A | Discharge status: Home / Self Care | Payer: Medicaid Other | Source: Home / Self Care | Attending: Emergency Medicine | Admitting: Emergency Medicine

## 2011-10-14 ENCOUNTER — Encounter: Payer: Self-pay | Admitting: Family Medicine

## 2011-10-14 ENCOUNTER — Ambulatory Visit (INDEPENDENT_AMBULATORY_CARE_PROVIDER_SITE_OTHER): Payer: Medicaid Other | Admitting: Family Medicine

## 2011-10-14 ENCOUNTER — Telehealth: Payer: Self-pay | Admitting: Family Medicine

## 2011-10-14 VITALS — BP 110/70 | HR 80 | Temp 98.1°F | Ht 65.0 in | Wt 178.0 lb

## 2011-10-14 DIAGNOSIS — E119 Type 2 diabetes mellitus without complications: Secondary | ICD-10-CM

## 2011-10-14 DIAGNOSIS — E1165 Type 2 diabetes mellitus with hyperglycemia: Secondary | ICD-10-CM | POA: Insufficient documentation

## 2011-10-14 DIAGNOSIS — L0231 Cutaneous abscess of buttock: Secondary | ICD-10-CM

## 2011-10-14 DIAGNOSIS — IMO0002 Reserved for concepts with insufficient information to code with codable children: Secondary | ICD-10-CM | POA: Insufficient documentation

## 2011-10-14 NOTE — Assessment & Plan Note (Signed)
A: L gluteal abscess healing.  P: POC A1c to screen for DM2 given CBGs > 100 in the past Continue local wound care with sitz baths and dressing changes.  Wound is shallow without reaccumulation of abscess, no packing required. No evidence of cellulitis, so antibiotics at this time.  Reviewed s/s to prompt return to care. F/u in 2 weeks with PCP to monitor healing.

## 2011-10-14 NOTE — Progress Notes (Signed)
Subjective:     Patient ID: Sara Brock, female   DOB: 10-04-1980, 31 y.o.   MRN: [id number]  HPI 31 yo presents for f/u of L gluteal abscess ID done at [facility] ED two days ago. She reports improvement in her pain. She has pain when she sits only. She is taking the vicodin as needed. She denies fever. She has a history of gestational diabetes 5 years ago. She is a non-smoker.   Review of Systems As per HPI     Objective:   Physical Exam BP 110/70  Pulse 80  Temp 98.1 F (36.7 C) (Oral)  Ht 5\' 5"  (1.651 m)  Wt 178 lb (80.74 kg)  BMI 29.62 kg/m2  LMP 09/16/2011 General appearance: alert, cooperative and no distress Skin:  L gluteal medial: shallow abscess 1.5x 1 cm with underlying subcutaneous tissue pink, surrounding skin intact without erythema or dimpling. No streaking. Underlying area indurated. No fluctuance. No packing in abscess.      Assessment and Plan:

## 2011-10-14 NOTE — Telephone Encounter (Signed)
Called patient to inform her of A1c>14. Plan: 1. Patient to call in for f/u appt next week. 2. Recheck A1c at f/u.  3. CBG at f/u 4. Start diabetes treatment at f/u.  5. Check gluteal wound at f/u. ? Need for antibiotics.   Patient agreed with plan and voiced understanding.

## 2011-10-14 NOTE — Patient Instructions (Addendum)
Sara Brock,  Thank you for coming in today.  Your left gluteal (butt) abscess appears to be healing well.   1. Clean with sitz bath 2-3 x daily (warm water) 2. Shower normally.  3. Take vicodin for severe pain and take tylenol for moderate pain. Be careful not to take more than 3000 mg of tylenol daily.   I will check your A1c today to screen for diabetes given blood sugars in the past > 100.  Make a f/u appt with Dr. Deirdre Priest for two weeks.  F/u sooner if: worsening pain, fever, redevelopment of abscess.

## 2011-10-14 NOTE — ED Provider Notes (Signed)
Medical screening examination/treatment/procedure(s) were performed by non-physician practitioner and as supervising physician I was immediately available for consultation/collaboration.   Ligia Duguay, MD 10/14/11 0007 

## 2011-10-14 NOTE — Assessment & Plan Note (Signed)
Called patient to inform her about her A1c. Will defer treatment to her PCP. She has f/u appt on

## 2011-10-18 ENCOUNTER — Encounter: Payer: Self-pay | Admitting: Family Medicine

## 2011-10-18 ENCOUNTER — Ambulatory Visit (INDEPENDENT_AMBULATORY_CARE_PROVIDER_SITE_OTHER): Payer: Medicaid Other | Admitting: Family Medicine

## 2011-10-18 VITALS — BP 114/78 | HR 89 | Temp 98.1°F | Ht 65.0 in | Wt 178.0 lb

## 2011-10-18 DIAGNOSIS — E119 Type 2 diabetes mellitus without complications: Secondary | ICD-10-CM

## 2011-10-18 LAB — CBC
HCT: 39.4 % (ref 36.0–46.0)
Hemoglobin: 12.5 g/dL (ref 12.0–15.0)
MCH: 23.9 pg — ABNORMAL LOW (ref 26.0–34.0)
MCHC: 31.7 g/dL (ref 30.0–36.0)

## 2011-10-18 MED ORDER — METFORMIN HCL 500 MG PO TABS: 500.0000 mg | ORAL_TABLET | Freq: Two times a day (BID) | ORAL | Status: DC

## 2011-10-18 NOTE — Patient Instructions (Addendum)
Cut out all sweet drinks - soda, juice drink water or diet drinks  Take metformin one tab a day with food for 2-3 days then twice daily.  We will refer you to diabetes education  I will call you if your lab tests are not normal.  Otherwise we will discuss them at your next visit.

## 2011-10-18 NOTE — Assessment & Plan Note (Signed)
Poor control.  Will check labs and start metformin.  Hopefully decreasing her high sweet drink intake will help a great deal.  Send to diabetes education.  Close follow up

## 2011-10-18 NOTE — Progress Notes (Signed)
  Subjective:    Patient ID: Sara Brock, female    DOB: November 19, 1980, 31 y.o.   MRN: [id number]  HPI  Diabetes Recent A1c> 14 after and abscess.  Has been having polyuria/dipsia and dry mouth and fatigue.  Infection is better.  Formerly drank at least 2 large sodas and juice each day but has decreased dramatically since found out diagnosis.     Review of Symptoms - see HPI  PMH - Smoking status noted.  Had gestational diabetes     Review of Systems     Objective:   Physical Exam  Alert no acute distress       Assessment & Plan:

## 2011-10-19 LAB — COMPREHENSIVE METABOLIC PANEL
AST: 12 U/L (ref 0–37)
Alkaline Phosphatase: 65 U/L (ref 39–117)
BUN: 11 mg/dL (ref 6–23)
Creat: 0.72 mg/dL (ref 0.50–1.10)

## 2011-10-25 ENCOUNTER — Ambulatory Visit (INDEPENDENT_AMBULATORY_CARE_PROVIDER_SITE_OTHER): Payer: Medicaid Other | Admitting: Family Medicine

## 2011-10-25 ENCOUNTER — Encounter: Payer: Self-pay | Admitting: Family Medicine

## 2011-10-25 VITALS — BP 122/73 | HR 88 | Temp 98.1°F | Ht 65.0 in | Wt 179.0 lb

## 2011-10-25 DIAGNOSIS — F329 Major depressive disorder, single episode, unspecified: Secondary | ICD-10-CM

## 2011-10-25 DIAGNOSIS — F3289 Other specified depressive episodes: Secondary | ICD-10-CM

## 2011-10-25 DIAGNOSIS — E119 Type 2 diabetes mellitus without complications: Secondary | ICD-10-CM

## 2011-10-25 MED ORDER — METFORMIN HCL 1000 MG PO TABS: 1000.0000 mg | ORAL_TABLET | Freq: Two times a day (BID) | ORAL | Status: DC

## 2011-10-25 NOTE — Patient Instructions (Addendum)
Take 2 (500 mg) metformin tabs twice daily until you get the new prescription and then take 1 (1000 mg tab) twice daily    See diabetes education   We will aim to get your fasting (whn you first wake up) below 200   Once your blood sugar is regularly below 200 fasting see an eye doctor  See me back in 1 month

## 2011-10-25 NOTE — Progress Notes (Signed)
  Subjective:    Patient ID: Sara Brock, female    DOB: 01/18/1981, 31 y.o.   MRN: [id number]  HPI  DIABETES Disease Monitoring: Blood Sugar ranges-not checking has not gone to diabetes education yet is on Thursday Polyuria/phagia/dipsia- improved      Visual problems- still blurry  Medications: Compliance- taking metformin twice daily without problems Hypoglycemic symptoms- no    Review of Systems     Objective:   Physical Exam  Diabetic Foot Check -  Appearance - no lesions, ulcers or calluses Skin - no unusual pallor or redness Monofilament testing -  Right - Great toe, medial, central, lateral ball and posterior foot intact Left - Great toe, medial, central, lateral ball and posterior foot intact       Assessment & Plan:

## 2011-10-28 ENCOUNTER — Encounter: Payer: Medicaid Other | Attending: Family Medicine | Admitting: Dietician

## 2011-10-28 ENCOUNTER — Encounter: Payer: Self-pay | Admitting: Dietician

## 2011-10-28 VITALS — Ht 65.0 in | Wt 180.8 lb

## 2011-10-28 DIAGNOSIS — Z713 Dietary counseling and surveillance: Secondary | ICD-10-CM | POA: Insufficient documentation

## 2011-10-28 DIAGNOSIS — E119 Type 2 diabetes mellitus without complications: Secondary | ICD-10-CM

## 2011-10-28 NOTE — Progress Notes (Signed)
  Medical Nutrition Therapy:  Appt start time: 1615  end time:  1730.   Assessment:  Primary concerns today: Comes today with a new diagnosis of type 2 diabetes.  Wants to learn more about how to eat, how to take care of herself and to feel better.  Has a history of gestational diabetes in the past.  Confesses that she did not lose the weight gained with pregnancy and has not paid attention to her blood glucose levels over the years.  Current A1C is at 14.0% on 10/14/2011.  C/O constantly being tired and having a dry mouth.  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  Currently insured by Medicaid. Notes that she has not had a recent dilated eye examination.  BLOOD GLUCOSE MONITORING: Not currently monitoring her blood glucose.    HYPOGLYCEMIA: Gives no S/S of low blood glucose.  HYPERGLYCEMIA:  Gives S/S of thirst, increased fluid intake, tiredness.  MEDICATIONS: Currently on Metformin 1000 mg in the AM and PM for glucose control.  Having vomiting and diarrhea.  Advised her to take after a meal and not on an empty stomach.  If she is unable to take the Metformin, she might tolerate the long acting medications, Fortamet or Glumetza.  They tend to have fewer GI side effects.   DIETARY INTAKE:  Usual eating pattern includes 2-3 meals and 1-2 snacks per day.  24-hr recall:  B ( AM): No appetite.  11:00 AM [place] , egg, cheese 1 slice,  1 [place] sausage patty on a flat bread.  (ate about 3/4 of this)  Drank water. (takes the metformin) Snk ( AM):   L ( PM): 2:00 might have yogurt or an apple or orange. Drink fruit water (sugar free) Snk ( PM):  D ( PM): 6:00 baked chicken, turnip green, cabbage, black eyed peas,  (ate 1/2 at restaurant) (took metformin) Snk ( PM): 8:30-9:00 finished the remainder of her dinner meal from the restaurant. 11:00: ate a cup of yogurt.   Beverages: Water,flavored water.  Usual physical activity: Currently  trying to walk 20 minutes every day.  Advised to try to increase her time and to keep up the daily walking as much as possble  Estimated energy needs:  HT: 65 in  WT: 180.8 lb  BMI: 30.1 kg/m2  Adj WT: 148 lb (67 kg0 1300-1400 calories 150-155 g carbohydrates 100-105 g protein 36-39 g fat  Progress Towards Goal(s):  In progress.   Nutritional Diagnosis:  [place]-2.1 Inpaired nutrition utilization As related to glucose metabolism.  As evidenced by new diagnosis of type 2 diabetes, A1C of 14%, S/S hyperglycemia..    Intervention:  Nutrition Counseled regarding the use of the Carb restricted diet for glucose control, label reading resting sugar, increasing dietary fiber, carb counting, use of exercise and weight loss along with medications for glucose control.  Handouts given during visit include:  Living Well with Diabetes  Controlling Blood Glucose  Yellow Card with diet prescription.  Menu suggestions for 2-3 Carb servings per meal  Snack list  Monitoring/Evaluation:  Dietary intake, exercise, blood glucose levels once monitoring, and body weight in 8-12 weeks.  To call with questions or issues.

## 2011-10-28 NOTE — Patient Instructions (Addendum)
   Continue to take the metformin after a meal in the morning.  11 AM is OK.  Then take after the evening meal.    For the feelings of low blood glucose, eat a sandwich rather than drink the juice.  [name] well feel low and really be in the 120 or greater range.  Metformin as a rule does not lower blood glucose.   Continue to try to have the breakfast meal and at the 2:00 PM meal/snack plan to have a protein with the snack.   Aim to try the 30 or 45 gm of Carb meal plan.  The 30 gm examples might better suit your glucose levels at this time.  Talk with your MD regarding using a glucose meter to monitor your blood glucose.  If you get a meter, then plan to come back and let's look at monitoring goals and how your glucose levels are doing.  While your glucose levels are high, drink plenty of water.  Continue to do your walking daily.  Aim to slowly work up to 30-45 minutes daily.  Remember to walk in the cooler part of the day and to drink plenty of water before walking and take a bottle of water with you.  Use your food label for serving size and counting carbs.  Aim for 2 gm of fiber per slice of bread and 3+ gm of fiber per serving of cereal  Keep the sugar level on the food label at (0-9 gm) per serving.  Use the Calorieking.com web site to help with counting carbs.  Plan to have a protein source at all meals and snacks.  Plan to follow-up in the next 4-6 weeks.  Call or e-mail with questions.

## 2011-10-31 ENCOUNTER — Encounter: Payer: Self-pay | Admitting: Dietician

## 2011-11-29 ENCOUNTER — Encounter: Payer: Self-pay | Admitting: Family Medicine

## 2011-11-29 ENCOUNTER — Ambulatory Visit (INDEPENDENT_AMBULATORY_CARE_PROVIDER_SITE_OTHER): Payer: Medicaid Other | Admitting: Family Medicine

## 2011-11-29 VITALS — BP 113/69 | HR 88 | Temp 98.2°F | Ht 65.0 in | Wt 187.0 lb

## 2011-11-29 DIAGNOSIS — Z23 Encounter for immunization: Secondary | ICD-10-CM

## 2011-11-29 DIAGNOSIS — E119 Type 2 diabetes mellitus without complications: Secondary | ICD-10-CM

## 2011-11-29 NOTE — Patient Instructions (Addendum)
Check your blood sugar when you have not eaten for 6-8 hours.   It should be less than 150 or so.  As long as it is < 150 do not take anything.    If it is regularly > 160 then start the metformin 1/2 tablet twice daily every day  Write down your readings with the time and bring them in   Come back in 1 month for an A1c  See the eye doctor for a diabetes eye check

## 2011-11-30 NOTE — Progress Notes (Signed)
  Subjective:    Patient ID: Sara Brock, female    DOB: 05-16-1980, 31 y.o.   MRN: [id number]  HPI DIABETES Disease Monitoring: Blood Sugar ranges-checking intermittenly.  Rarely more than 160  Polyuria/phagia/dipsia- improved      Visual problems- improved back to normal   Medications: Compliance- has not taken metformin for 3 weeks because when increased to 1000 mg gave her upset stomach.  Has stopped all sweet drinks Hypoglycemic symptoms- no  Review of Symptoms - see HPI  PMH - Smoking status noted.       Review of Systems     Objective:   Physical Exam  Alert no acute distress      Assessment & Plan:

## 2011-11-30 NOTE — Assessment & Plan Note (Signed)
Unsure of status.  She reports markedly improved blood sugar off all medications due to stopping juice.  Will monitor fasting blood sugar and recheck A1c in 1 month

## 2011-12-13 ENCOUNTER — Encounter: Payer: Self-pay | Admitting: Home Health Services

## 2011-12-15 ENCOUNTER — Encounter: Payer: Self-pay | Admitting: Home Health Services

## 2011-12-21 ENCOUNTER — Encounter (HOSPITAL_COMMUNITY): Payer: Self-pay | Admitting: Emergency Medicine

## 2011-12-21 ENCOUNTER — Emergency Department (HOSPITAL_COMMUNITY): Payer: Medicaid Other

## 2011-12-21 ENCOUNTER — Emergency Department (HOSPITAL_COMMUNITY)
Admission: EM | Admit: 2011-12-21 | Discharge: 2011-12-21 | Disposition: A | Discharge status: Home / Self Care | Payer: Medicaid Other | Attending: Emergency Medicine | Admitting: Emergency Medicine

## 2011-12-21 DIAGNOSIS — E119 Type 2 diabetes mellitus without complications: Secondary | ICD-10-CM | POA: Insufficient documentation

## 2011-12-21 DIAGNOSIS — Z79899 Other long term (current) drug therapy: Secondary | ICD-10-CM | POA: Insufficient documentation

## 2011-12-21 DIAGNOSIS — IMO0001 Reserved for inherently not codable concepts without codable children: Secondary | ICD-10-CM | POA: Insufficient documentation

## 2011-12-21 DIAGNOSIS — S0993XA Unspecified injury of face, initial encounter: Secondary | ICD-10-CM | POA: Insufficient documentation

## 2011-12-21 DIAGNOSIS — Y92410 Unspecified street and highway as the place of occurrence of the external cause: Secondary | ICD-10-CM | POA: Insufficient documentation

## 2011-12-21 DIAGNOSIS — M542 Cervicalgia: Secondary | ICD-10-CM | POA: Insufficient documentation

## 2011-12-21 DIAGNOSIS — Y9389 Activity, other specified: Secondary | ICD-10-CM | POA: Insufficient documentation

## 2011-12-21 MED ORDER — HYDROCODONE-ACETAMINOPHEN 5-500 MG PO TABS: 1.0000 | ORAL_TABLET | Freq: Four times a day (QID) | ORAL | Status: DC | PRN

## 2011-12-21 MED ORDER — ACETAMINOPHEN 325 MG PO TABS
650.0000 mg | ORAL_TABLET | Freq: Once | ORAL | Status: AC
  Administered 2011-12-21: 650 mg via ORAL
  Filled 2011-12-21: qty 2
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
## 2011-12-23 ENCOUNTER — Encounter (HOSPITAL_COMMUNITY): Payer: Self-pay | Admitting: Emergency Medicine

## 2011-12-23 ENCOUNTER — Emergency Department (INDEPENDENT_AMBULATORY_CARE_PROVIDER_SITE_OTHER): Payer: Medicaid Other

## 2011-12-23 ENCOUNTER — Emergency Department (HOSPITAL_COMMUNITY)
Admission: EM | Admit: 2011-12-23 | Discharge: 2011-12-23 | Disposition: A | Discharge status: Home / Self Care | Payer: Medicaid Other | Source: Home / Self Care | Attending: Emergency Medicine | Admitting: Emergency Medicine

## 2011-12-23 DIAGNOSIS — G5600 Carpal tunnel syndrome, unspecified upper limb: Secondary | ICD-10-CM

## 2011-12-23 MED ORDER — TRAMADOL HCL 50 MG PO TABS: 100.0000 mg | ORAL_TABLET | Freq: Three times a day (TID) | ORAL | Status: DC | PRN

## 2011-12-23 MED ORDER — PREDNISONE 5 MG PO KIT: 1.0000 | PACK | Freq: Every day | ORAL | Status: DC
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
## 2011-12-23 NOTE — ED Notes (Signed)
Reports shooting pain in arm and numbness in fingertips.  "feels like whole arm is asleep".  Noticed 11/19.

## 2012-04-05 ENCOUNTER — Emergency Department (HOSPITAL_COMMUNITY)
Admission: EM | Admit: 2012-04-05 | Discharge: 2012-04-06 | Disposition: A | Discharge status: Home / Self Care | Payer: Medicaid Other | Attending: Emergency Medicine | Admitting: Emergency Medicine

## 2012-04-05 ENCOUNTER — Encounter (HOSPITAL_COMMUNITY): Payer: Self-pay | Admitting: Emergency Medicine

## 2012-04-05 ENCOUNTER — Emergency Department (HOSPITAL_COMMUNITY): Payer: Medicaid Other

## 2012-04-05 DIAGNOSIS — R6889 Other general symptoms and signs: Secondary | ICD-10-CM | POA: Insufficient documentation

## 2012-04-05 DIAGNOSIS — E119 Type 2 diabetes mellitus without complications: Secondary | ICD-10-CM | POA: Insufficient documentation

## 2012-04-05 DIAGNOSIS — R079 Chest pain, unspecified: Secondary | ICD-10-CM

## 2012-04-05 DIAGNOSIS — R059 Cough, unspecified: Secondary | ICD-10-CM | POA: Insufficient documentation

## 2012-04-05 DIAGNOSIS — R0789 Other chest pain: Secondary | ICD-10-CM | POA: Insufficient documentation

## 2012-04-05 DIAGNOSIS — Z79899 Other long term (current) drug therapy: Secondary | ICD-10-CM | POA: Insufficient documentation

## 2012-04-05 DIAGNOSIS — R05 Cough: Secondary | ICD-10-CM | POA: Insufficient documentation

## 2012-04-05 DIAGNOSIS — Z872 Personal history of diseases of the skin and subcutaneous tissue: Secondary | ICD-10-CM | POA: Insufficient documentation

## 2012-04-05 HISTORY — DX: Other seasonal allergic rhinitis: J30.2

## 2012-04-05 LAB — CBC
HCT: 36.5 % (ref 36.0–46.0)
MCHC: 33.4 g/dL (ref 30.0–36.0)
MCV: 73 fL — ABNORMAL LOW (ref 78.0–100.0)
RDW: 14.4 % (ref 11.5–15.5)
WBC: 7 10*3/uL (ref 4.0–10.5)

## 2012-04-05 LAB — BASIC METABOLIC PANEL
BUN: 10 mg/dL (ref 6–23)
Chloride: 102 mEq/L (ref 96–112)
Creatinine, Ser: 0.79 mg/dL (ref 0.50–1.10)
GFR calc Af Amer: >90 mL/min (ref >90)

## 2012-04-05 MED ORDER — GI COCKTAIL ~~LOC~~
30.0000 mL | Freq: Once | ORAL | Status: AC
  Administered 2012-04-05: 30 mL via ORAL
  Filled 2012-04-05: qty 30

## 2012-04-05 NOTE — ED Notes (Signed)
Pt back from x-ray.

## 2012-04-05 NOTE — ED Notes (Addendum)
C/o constant aching to L side of chest that radiates to L arm since waking up at 6:20am.  Denies sob, nausea, and vomiting.  States she felt lightheaded earlier today and after lying down for 1 hour the lightheadedness resolved.  Pain is worse when laughing and coughing.  C/o dry cough that pt relates to allergies. Smoked marijuana around 12pm today.

## 2012-04-05 NOTE — ED Provider Notes (Signed)
History     CSN: [id number]  Arrival date & time 04/05/12  2126   First MD Initiated Contact with Patient 04/05/12 2216      Chief Complaint  Patient presents with  . Chest Pain    (Consider location/radiation/quality/duration/timing/severity/associated sxs/prior treatment) HPI History provided by pt.   Pt woke this morning w/ constant, left-sided chest pressure w/ radiation to LUE.  Non-exertional and non-positional but aggravated by ROM of LUE as well as coughing/sneezing.  No associated fever, cough, SOB, N/V, diaphoresis, LE edema/ttp.  Had brief, self-limited episode of abdominal pain this morning, but not since. No relief w/ zantac.  No h/o GERD.  Denies recent trauma or heavy lifting.  RF for ACS include tobacco abuse and diabetes.  No RF for PE.  Past Medical History  Diagnosis Date  . Abscess   . Diabetes mellitus   . Seasonal allergies     Past Surgical History  Procedure Laterality Date  . Tubal ligation      Family History  Problem Relation Age of Onset  . Hypertension Mother   . Hyperlipidemia Mother     History  Substance Use Topics  . Smoking status: Never Smoker   . Smokeless tobacco: Not on file  . Alcohol Use: No    OB History   Grav Para Term Preterm Abortions TAB SAB Ect Mult Living                  Review of Systems  All other systems reviewed and are negative.    Allergies  Review of patient's allergies indicates no known allergies.  Home Medications   Current Outpatient Rx  Name  Route  Sig  Dispense  Refill  . metFORMIN (GLUCOPHAGE) 1000 MG tablet   Oral   Take 0.5 tablets (500 mg total) by mouth 2 (two) times daily with a meal.   180 tablet   3   . traMADol (ULTRAM) 50 MG tablet   Oral   Take 2 tablets (100 mg total) by mouth every 8 (eight) hours as needed for pain.   30 tablet   0     BP 121/72  Pulse 77  Temp(Src) 98.1 F (36.7 C) (Oral)  Resp 19  SpO2 100%  LMP 04/03/2012  Physical Exam  Nursing note and  vitals reviewed. Constitutional: She is oriented to person, place, and time. She appears well-developed and well-nourished. No distress.  HENT:  Head: Normocephalic and atraumatic.  Eyes:  Normal appearance  Neck: Normal range of motion.  Cardiovascular: Normal rate, regular rhythm and intact distal pulses.   Pulmonary/Chest: Effort normal and breath sounds normal. No respiratory distress. She exhibits no tenderness.  No pleuritic pain reported.  Pain in chest aggravated by passive ROM of LUE.   Abdominal: Soft. Bowel sounds are normal. She exhibits no distension. There is no tenderness. There is no guarding.  Musculoskeletal: Normal range of motion.  No peripheral edema or calf tenderness  Neurological: She is alert and oriented to person, place, and time.  Skin: Skin is warm and dry. No rash noted.  Psychiatric: She has a normal mood and affect. Her behavior is normal.    ED Course  Procedures (including critical care time)   Date: 04/05/2012  Rate: 83  Rhythm: normal sinus rhythm  QRS Axis: right  Intervals: normal  ST/T Wave abnormalities: normal  Conduction Disutrbances:none  Narrative Interpretation:   Old EKG Reviewed: none available   Labs Reviewed  CBC - Abnormal; Notable for  the following:    MCV 73.0 (*)    MCH 24.4 (*)    All other components within normal limits  BASIC METABOLIC PANEL - Abnormal; Notable for the following:    Glucose, Bld 149 (*)    All other components within normal limits  POCT I-STAT TROPONIN I   Dg Chest 2 View  04/05/2012  *RADIOLOGY REPORT*  Clinical Data: Chest pain and left arm numbness.  CHEST - 2 VIEW  Comparison: Chest radiograph performed 06/22/2011  Findings: The lungs are well-aerated and clear.  There is no evidence of focal opacification, pleural effusion or pneumothorax.  The heart is normal in size; the mediastinal contour is within normal limits.  No acute osseous abnormalities are seen.  IMPRESSION: No acute cardiopulmonary  process seen.   Original Report Authenticated By: Tonia Ghent, M.D.      1. Chest pain       MDM  32yo diabetic F presents w/ non-traumatic CP.  Doubt ACS; TIMI 0, pain atypical and reproducible w/ ROM of LUE on exam, EKG non-ischemic and troponin neg.  No RF for or exam findings consistent w/ PE.  CXR pending.  Suspect chest wall pain, pleurisy or indigestion.  Pt to receive GI cocktail. Will reassess shortly.  10:48 PM    No relief w/ GI cocktail.  Pt now reports that her 17yo daughter has been missing for 3 days and she has been under a lot of stress.  On repeat exam, VSS, chest "soreness" w/ palpation and pain w/ ROM LUE.  Will treat conservatively for muscle strain w/ NSAID, rest, ice.  Advised f/u w/ her PCP.  Return precautions discussed. 12:04 AM         Otilio Miu, PA-C 04/06/12 0004

## 2012-04-05 NOTE — ED Notes (Signed)
Patient transported to X-ray 

## 2012-04-07 NOTE — ED Provider Notes (Signed)
Medical screening examination/treatment/procedure(s) were conducted as a shared visit with non-physician practitioner(s) and myself.  I personally evaluated the patient during the encounter  Pt well appearing, pain reproduced with movement, stable for d/c Doubt ACS/PE/Dissection  Joya Gaskins, MD 04/07/12 [phone]

## 2012-04-19 ENCOUNTER — Encounter: Payer: Self-pay | Admitting: *Deleted

## 2012-05-22 ENCOUNTER — Encounter: Payer: Self-pay | Admitting: *Deleted

## 2012-05-29 ENCOUNTER — Encounter: Payer: Self-pay | Admitting: Family Medicine

## 2012-05-29 ENCOUNTER — Ambulatory Visit (INDEPENDENT_AMBULATORY_CARE_PROVIDER_SITE_OTHER): Payer: Medicaid Other | Admitting: Family Medicine

## 2012-05-29 VITALS — BP 125/74 | HR 79 | Temp 98.5°F | Ht 65.0 in | Wt 203.0 lb

## 2012-05-29 DIAGNOSIS — E1165 Type 2 diabetes mellitus with hyperglycemia: Secondary | ICD-10-CM

## 2012-05-29 DIAGNOSIS — IMO0001 Reserved for inherently not codable concepts without codable children: Secondary | ICD-10-CM

## 2012-05-29 DIAGNOSIS — F3289 Other specified depressive episodes: Secondary | ICD-10-CM

## 2012-05-29 DIAGNOSIS — F329 Major depressive disorder, single episode, unspecified: Secondary | ICD-10-CM

## 2012-05-29 DIAGNOSIS — IMO0002 Reserved for concepts with insufficient information to code with codable children: Secondary | ICD-10-CM

## 2012-05-29 MED ORDER — FLUOXETINE HCL 20 MG PO TABS: 20.0000 mg | ORAL_TABLET | Freq: Every day | ORAL | Status: DC

## 2012-05-29 NOTE — Assessment & Plan Note (Signed)
Not currently on medication. Made great improvement with diet alone. Has recently added exercise.  Plan: will continue with diet and exercise and recheck A1c in 3 months. If not at goal will start additional medication.
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
## 2012-05-29 NOTE — Assessment & Plan Note (Addendum)
Recent onset of depression likely in reaction to relationship difficulties with daughter. Plan: given Dr. Carola Rhine information to schedule counseling session. Started on fluoxetine 20 mg daily. Advised could take 4 weeks for improvement. To return to care if develops SI or HI, you start to see or hear things that are not there, or if your depression worsens significantly please seek medical attention. F/u in 2 weeks.

## 2012-05-29 NOTE — Patient Instructions (Addendum)
Nice to meet you today. We will start you on fluoxetine, which is an antidepressant. This can take several weeks to take full effect, so please take this every day even if you feel it is not working. If you feel as though you are going to hurt yourself or others, you start to see or hear things that are not there, or if your depression worsens significantly please seek medical attention. Please give Dr. Pascal Lux, our psychologist, a call to schedule an appointment.  Fluoxetine capsules or tablets (Depression/Mood Disorders) What is this medicine? FLUOXETINE (floo OX e teen) belongs to a class of drugs known as selective serotonin reuptake inhibitors (SSRIs). It helps to treat mood problems such as depression, obsessive compulsive disorder, and panic attacks. It can also treat certain eating disorders. This medicine may be used for other purposes; ask your health care provider or pharmacist if you have questions. What should I tell my health care provider before I take this medicine? They need to know if you have any of these conditions: -bipolar disorder or mania -diabetes -glaucoma -liver disease -psychosis -seizures -suicidal thoughts or history of attempted suicide -an unusual or allergic reaction to fluoxetine, other medicines, foods, dyes, or preservatives -pregnant or trying to get pregnant -breast-feeding How should I use this medicine? Take this medicine by mouth with a glass of water. Follow the directions on the prescription label. You can take this medicine with or without food. Take your medicine at regular intervals. Do not take it more often than directed. Do not stop taking except on your doctor's advice. A special MedGuide will be given to you by the pharmacist with each prescription and refill. Be sure to read this information carefully each time. Talk to your pediatrician regarding the use of this medicine in children. While this drug may be prescribed for children as young as 7  years for selected conditions, precautions do apply. Overdosage: If you think you have taken too much of this medicine contact a poison control center or emergency room at once. NOTE: This medicine is only for you. Do not share this medicine with others. What if I miss a dose? If you miss a dose, skip the missed dose and go back to your regular dosing schedule. Do not take double or extra doses. What may interact with this medicine? Do not take fluoxetine with any of the following medications: -other medicines containing fluoxetine, like Sarafem or Symbyax -certain diet drugs like dexfenfluramine, fenfluramine, phentermine -cisapride -linezolid -medicines called MAO Inhibitors like Azilect, Carbex, Eldepryl, Marplan, Nardil, and Parnate -methylene blue -pimozide -procarbazine -thioridazine -tryptophan Fluoxetine may also interact with the following medications: -alcohol -any other medicines for depression, anxiety, or psychotic disturbances -aspirin and aspirin-like medicines -carbamazepine -cyproheptadine -dextromethorphan -flecainide -lithium -medicines for diabetes -medicines for migraine headache, like sumatriptan -medicines for sleep -medicines that treat or prevent blood clots like warfarin, enoxaparin, and dalteparin -metoprolol -NSAIDs, medicines for pain and inflammation, like ibuprofen or naproxen -phenytoin -propafenone -propranolol -St. John's wort -vinblastine This list may not describe all possible interactions. Give your health care provider a list of all the medicines, herbs, non-prescription drugs, or dietary supplements you use. Also tell them if you smoke, drink alcohol, or use illegal drugs. Some items may interact with your medicine. What should I watch for while using this medicine? Visit your doctor or health care professional for regular checks on your progress. Continue to take your medicine even if you do not immediately feel better. It can take  several weeks  before you notice the full effect of this medicine. Patients and their families should watch out for worsening depression or thoughts of suicide. Also watch out for any sudden or severe changes in feelings such as feeling anxious, agitated, panicky, irritable, hostile, aggressive, impulsive, severely restless, overly excited and hyperactive, or not being able to sleep. If this happens, especially at the beginning of treatment or after a change in dose, call your doctor. You may get drowsy or dizzy. Do not drive, use machinery, or do anything that needs mental alertness until you know how this medicine affects you. Do not stand or sit up quickly, especially if you are an older patient. This reduces the risk of dizzy or fainting spells. Alcohol can make you more drowsy and dizzy. Avoid alcoholic drinks. Your mouth may get dry. Chewing sugarless gum or sucking hard candy, and drinking plenty of water may help. Contact your doctor if the problem does not go away or is severe. If you have diabetes, this medicine may affect blood sugar levels. Check your blood sugar. Talk to your doctor or health care professional if you notice changes. If you have been taking this medicine regularly for some time, do not suddenly stop taking it. You must gradually reduce the dose or you may get side effects or have a worsening of your condition. Ask your doctor or health care professional for advice. Do not treat yourself for coughs, colds or allergies without asking your doctor or health care professional for advice. Some ingredients can increase possible side effects. What side effects may I notice from receiving this medicine? Side effects that you should report to your doctor or health care professional as soon as possible: -allergic reactions like skin rash, itching or hives, swelling of the face, lips, or tongue -breathing problems -confusion -fast or irregular heart rate, palpitations -flu-like fever,  chills, cough, muscle or joint aches and pains -seizures -suicidal thoughts or other mood changes -tremors -trouble sleeping -unusual bleeding or bruising -unusually tired or weak -vomiting Side effects that usually do not require medical attention (report to your doctor or health care professional if they continue or are bothersome): -blurred vision -change in sex drive or performance -diarrhea -dry mouth -flushing -headache -increased or decreased appetite -nausea -sweating This list may not describe all possible side effects. Call your doctor for medical advice about side effects. You may report side effects to FDA at 1-800-FDA-1088. Where should I keep my medicine? Keep out of the reach of children. Store at room temperature between 15 and 30 degrees C (59 and 86 degrees F). Throw away any unused medicine after the expiration date. NOTE: This sheet is a summary. It may not cover all possible information. If you have questions about this medicine, talk to your doctor, pharmacist, or health care provider.  2012, [employer]. (08/29/2009 3:23:25 PM)

## 2012-08-10 ENCOUNTER — Other Ambulatory Visit: Payer: Self-pay

## 2012-08-16 ENCOUNTER — Encounter: Payer: Self-pay | Admitting: Family Medicine

## 2012-08-16 ENCOUNTER — Ambulatory Visit (INDEPENDENT_AMBULATORY_CARE_PROVIDER_SITE_OTHER): Payer: Medicaid Other | Admitting: Family Medicine

## 2012-08-16 VITALS — BP 116/74 | HR 99 | Temp 98.0°F | Wt 198.0 lb

## 2012-08-16 DIAGNOSIS — IMO0002 Reserved for concepts with insufficient information to code with codable children: Secondary | ICD-10-CM

## 2012-08-16 DIAGNOSIS — F3289 Other specified depressive episodes: Secondary | ICD-10-CM

## 2012-08-16 DIAGNOSIS — IMO0001 Reserved for inherently not codable concepts without codable children: Secondary | ICD-10-CM

## 2012-08-16 DIAGNOSIS — F329 Major depressive disorder, single episode, unspecified: Secondary | ICD-10-CM

## 2012-08-16 DIAGNOSIS — E1165 Type 2 diabetes mellitus with hyperglycemia: Secondary | ICD-10-CM

## 2012-08-16 LAB — POCT GLYCOSYLATED HEMOGLOBIN (HGB A1C): Hemoglobin A1C: 9

## 2012-08-16 MED ORDER — TRAZODONE HCL 50 MG PO TABS: 50.0000 mg | ORAL_TABLET | Freq: Every evening | ORAL | Status: DC | PRN

## 2012-08-16 MED ORDER — SERTRALINE HCL 100 MG PO TABS: 100.0000 mg | ORAL_TABLET | Freq: Every day | ORAL | Status: DC

## 2012-08-16 MED ORDER — METFORMIN HCL ER 750 MG PO TB24: 750.0000 mg | ORAL_TABLET | Freq: Every day | ORAL | Status: DC

## 2012-08-16 NOTE — Assessment & Plan Note (Signed)
Worsened.  She feels she does not have time for counseling.  Will start zoloft and follow up in 2 weeks also trazadone as needed for sleep

## 2012-08-16 NOTE — Patient Instructions (Addendum)
Exercise every day for 20 min - treadmill   No sweet drinks (soda or juice)   Try substitute - unsweet tea, - any artificial sweetner is fine  Take the XR metformin every day - whenever you feel less nausea  Take the zoloft - every AM and the trazadone as needed for sleep in the evening  Come back in 2 weeks to check in - if things are worse then call

## 2012-08-16 NOTE — Assessment & Plan Note (Signed)
Not well controlled.  Start metformin XR and get her to retry avoiding sweet drinks and exercise which worked very well before

## 2012-08-16 NOTE — Progress Notes (Signed)
  Subjective:    Patient ID: Sara Brock, female    DOB: May 30, 1980, 32 y.o.   MRN: [id number]  HPI  DEPRESSION Disease Monitoring Current symptoms include anhedonia, depressed mood, fatigue, insomnia and weight gain            Symptoms have been unchanged     Is Exercising a little  Evidence of suicidal ideation: no  Medication Monitoring Compliance: tried prozac for 3 weeks did not help and stopped.  Never on antidepressant before and no family members with depression      Lightheadedness no     Insomnia yes ROS - See HPI  PMH Previous treatment includes: none   DIABETES Disease Monitoring: Blood Sugar ranges-200s Polyuria/phagia/dipsia- no      Visual problems- yes  Medications: Compliance- tried metformin for last few days - causes her to vomit diarrhes Hypoglycemic symptoms- no  Review of Symptoms - see HPI  PMH - Smoking status noted.        Review of Systems     Objective:   Physical Exam  Tearful Psych:  Cognition and judgment appear intact. Alert, communicative  and cooperative with normal attention span and concentration. No apparent delusions, illusions, hallucinations PHQ9 - 16      Assessment & Plan:

## 2012-08-30 ENCOUNTER — Ambulatory Visit: Payer: Medicaid Other | Admitting: Family Medicine

## 2012-08-31 ENCOUNTER — Telehealth: Payer: Self-pay | Admitting: Family Medicine

## 2012-08-31 NOTE — Telephone Encounter (Signed)
Left voicemail to call our office and give update

## 2012-11-03 ENCOUNTER — Ambulatory Visit (INDEPENDENT_AMBULATORY_CARE_PROVIDER_SITE_OTHER): Payer: Medicaid Other | Admitting: Family Medicine

## 2012-11-03 VITALS — BP 121/81 | HR 89 | Temp 98.6°F | Ht 65.0 in | Wt 194.0 lb

## 2012-11-03 DIAGNOSIS — M79671 Pain in right foot: Secondary | ICD-10-CM

## 2012-11-03 DIAGNOSIS — M79609 Pain in unspecified limb: Secondary | ICD-10-CM

## 2012-11-03 DIAGNOSIS — M79673 Pain in unspecified foot: Secondary | ICD-10-CM | POA: Insufficient documentation

## 2012-11-03 MED ORDER — DICLOFENAC SODIUM 75 MG PO TBEC: 75.0000 mg | DELAYED_RELEASE_TABLET | Freq: Two times a day (BID) | ORAL | Status: DC

## 2012-11-03 NOTE — Progress Notes (Signed)
Patient ID: Sara Brock, female   DOB: 05/14/1980, 32 y.o.   MRN: [id number]  [facility] [place] M. Danira Nylander, MD Phone: [phone]   Subjective: HPI: Patient is a 32 y.o. female presenting to clinic today for same day appointment for foot pain.  Subjective:    Sara Brock is a 32 y.o. female who presents with right foot/heel pain. Onset of the symptoms was a week ago. Precipitating event: none known. Current symptoms include: ability to bear weight, but with some pain. Aggravating factors: any weight bearing and worse in the morning. Symptoms have gradually worsened. Patient has had no prior foot problems. Evaluation to date: none. Treatment to date: OTC analgesics which are not very effective. Patient is concerned that since she has DM and with new foot pain, it should be evaluated.  The following portions of the patient's history were reviewed and updated as appropriate: allergies, current medications, past family history, past medical history, past social history, past surgical history and problem list.  Review of Systems Pertinent items are noted in HPI.    Objective:    BP 121/81  Pulse 89  Temp(Src) 98.6 F (37 C) (Oral)  Ht 5\' 5"  (1.651 m)  Wt 194 lb (87.998 kg)  BMI 32.28 kg/m2 Right foot:  no swelling, instability; ligaments intact, full range of motion of all ankle/foot joints. TTP of heel bad and distal achilles   Left foot:  normal exam, no swelling, tenderness, instability; ligaments intact, full range of motion of all ankle/foot joints   Imaging: Deferred because patient is going out of town today   Assessment:    Non-specific foot pain Possibly heel spur    Plan:    Natural history and expected course discussed. Questions answered. Rest, ice, compression, and elevation (RICE) therapy. NSAIDs per medication orders. Follow up as needed. Consider referral to sports medicine clinic for ultrasound, if needed.     See  Problem List and After Visit Summary

## 2012-11-03 NOTE — Assessment & Plan Note (Signed)
Natural history and expected course discussed. Questions answered. Rest, ice, compression, and elevation (RICE) therapy. NSAIDs per medication orders. Follow up as needed. Consider referral to sports medicine clinic for ultrasound, if needed.

## 2012-11-03 NOTE — Patient Instructions (Addendum)
Heel Spur A heel spur is a hook of bone that can form on the calcaneus (the heel bone and the largest bone of the foot). Heel spurs are often associated with plantar fasciitis and usually come in people who have had the problem for an extended period of time. The cause of the relationship is unknown. The pain associated with them is thought to be caused by an inflammation (soreness and redness) of the plantar fascia rather than the spur itself. The plantar fascia is a thick fibrous like tissue that runs from the calcaneus (heel bone) to the ball of the foot. This strong, tight tissue helps maintain the arch of your foot. It helps distribute the weight across your foot as you walk or run. Stresses placed on the plantar fascia can be tremendous. When it is inflamed normal activities become painful. Pain is worse in the morning after sleeping. After sleeping the plantar fascia is tight. The first movements stretch the fascia and this causes pain. As the tendon loosens, the pain usually gets better. It often returns with too much standing or walking.  About 70% of patients with plantar fasciitis have a heel spur. About half of people without foot pain also have heel spurs. DIAGNOSIS  The diagnosis of a heel spur is made by X-ray. The X-ray shows a hook of bone protruding from the bottom of the calcaneus at the point where the plantar fascia is attached to the heel bone.  TREATMENT  It is necessary to find out what is causing the stretching of the plantar fascia. If the cause is over-pronation (flat feet), orthotics and proper foot ware may help.  Stretching exercises, losing weight, wearing shoes that have a cushioned heel that absorbs shock, and elevating the heel with the use of a heel cradle, heel cup, or orthotics may all help. Heel cradles and heel cups provide extra comfort and cushion to the heel, and reduce the amount of shock to the sore area. AVOIDING THE PAIN OF PLANTAR FASCIITIS AND HEEL  SPURS  Consult a sports medicine professional before beginning a new exercise program.  Walking programs offer a good workout. There is a lower chance of overuse injuries common to the runners. There is less impact and less jarring of the joints.  Begin all new exercise programs slowly. If problems or pains develop, decrease the amount of time or distance until you are at a comfortable level.  Wear good shoes and replace them regularly.  Stretch your foot and the heel cords at the back of the ankle (Achilles tendons) both before and after exercise.  Run or exercise on even surfaces that are not hard. For example, asphalt is better than pavement.  Do not run barefoot on hard surfaces.  If using a treadmill, vary the incline.  Do not continue to workout if you have foot or joint problems. Seek professional help if they do not improve. HOME CARE INSTRUCTIONS   Avoid activities that cause you pain until you recover.  Use ice or cold packs to the problem or painful areas after working out.  Only take over-the-counter or prescription medicines for pain, discomfort, or fever as directed by your caregiver.  Soft shoe inserts or athletic shoes with air or gel sole cushions may be helpful.  If problems continue or become more severe, consult a sports medicine caregiver. Cortisone is a potent anti-inflammatory medication that may be injected into the painful area. You can discuss this treatment with your caregiver. MAKE SURE YOU:     Understand these instructions.  Will watch your condition.  Will get help right away if you are not doing well or get worse. Document Released: 02/24/2005 Document Revised: 04/12/2011 Document Reviewed: 04/28/2005 [employer] Patient Information 2014 [employer].  

## 2012-11-10 NOTE — Telephone Encounter (Signed)
Left voicemail for her to call and make an appointment [phone] (H)

## 2012-11-20 ENCOUNTER — Ambulatory Visit: Payer: Medicaid Other | Admitting: Family Medicine

## 2012-12-04 ENCOUNTER — Ambulatory Visit: Payer: Medicaid Other | Admitting: Family Medicine

## 2012-12-06 ENCOUNTER — Ambulatory Visit (INDEPENDENT_AMBULATORY_CARE_PROVIDER_SITE_OTHER): Payer: Medicaid Other | Admitting: Family Medicine

## 2012-12-06 ENCOUNTER — Encounter: Payer: Self-pay | Admitting: Family Medicine

## 2012-12-06 VITALS — BP 103/64 | HR 83 | Temp 98.8°F | Wt 196.0 lb

## 2012-12-06 DIAGNOSIS — F329 Major depressive disorder, single episode, unspecified: Secondary | ICD-10-CM

## 2012-12-06 DIAGNOSIS — Z23 Encounter for immunization: Secondary | ICD-10-CM

## 2012-12-06 DIAGNOSIS — F3289 Other specified depressive episodes: Secondary | ICD-10-CM

## 2012-12-06 DIAGNOSIS — E1165 Type 2 diabetes mellitus with hyperglycemia: Secondary | ICD-10-CM

## 2012-12-06 DIAGNOSIS — IMO0001 Reserved for inherently not codable concepts without codable children: Secondary | ICD-10-CM

## 2012-12-06 DIAGNOSIS — IMO0002 Reserved for concepts with insufficient information to code with codable children: Secondary | ICD-10-CM

## 2012-12-06 NOTE — Progress Notes (Signed)
  Subjective:    Patient ID: Sara Brock, female    DOB: 08-08-1980, 32 y.o.   MRN: [id number]  HPI  DIABETES Disease Monitoring: Blood Sugar ranges-not checking Polyuria/phagia/dipsia- no      Visual problems- feels vision is blurry Medications: Compliance- taking metformin several times a week.  Trouble remembering to take it Hypoglycemic symptoms- no  Depression Feels is some better since she is getting out of the house volunteering.  Takes the trazadone occasionally for sleep.  Did not take the SSRI.  Does not want to take pills.  Does not feel is bad enough to treat currenlty  Review of Symptoms - see HPI  PMH - Smoking status noted.       Review of Systems     Objective:   Physical Exam  Alert no acute distress Heart - Regular rate and rhythm.  No murmurs, gallops or rubs.    Lungs:  Normal respiratory effort, chest expands symmetrically. Lungs are clear to auscultation, no crackles or wheezes. Psych:  Cognition and judgment appear intact. Alert, communicative  and cooperative with normal attention span and concentration. No apparent delusions, illusions, hallucinations       Assessment & Plan:

## 2012-12-06 NOTE — Assessment & Plan Note (Signed)
Stable.  She is functioning and does not want any treatment currently.  Agrees to contact me if worsening

## 2012-12-06 NOTE — Patient Instructions (Signed)
Good to see you today!  Thanks for coming in.  YOur A1c is better - it could be perfect if you can tke your metformin every day.  You are doing great with diet   See an eye doctor for a check up   Rosalita Chessman will be in touch   Come back in early Feb to check

## 2012-12-06 NOTE — Progress Notes (Signed)
Patient Identified Concern:  Taking medications and stress at home Stage of Change Patient Is In:  Contemplation, willing to make changes within the next 6 months. Patient Reported Barriers:  Taking care of 4 kids, busy, medications make her sick Patient Reported Perceived Benefits:  TBD Patient Reports Self-Efficacy:   Patient self-reports low self-efficacy regarding taking medications, because they make her sick . Behavior Change Supports:  TBD Goals:  To take metformin at night when she brushes her teeth.  Patient Education:  We very briefly about self-care around diabetes.   Will follow up with health coach in 2 weeks.

## 2012-12-06 NOTE — Assessment & Plan Note (Signed)
Improved control despite intermittent medication use.  She has cut out most sweet drinks and trying to diet better although eats usually only once a day

## 2013-02-23 ENCOUNTER — Telehealth: Payer: Self-pay | Admitting: Home Health Services

## 2013-02-23 NOTE — Telephone Encounter (Signed)
LM with patient 12/15/12; 01/18/13; 02/23/13 to follow up with health coach goals.  There has been no response or follow up from patients at this time.

## 2013-04-04 ENCOUNTER — Ambulatory Visit: Payer: Medicaid Other | Admitting: Family Medicine

## 2013-05-11 ENCOUNTER — Ambulatory Visit (INDEPENDENT_AMBULATORY_CARE_PROVIDER_SITE_OTHER): Payer: Medicaid Other | Admitting: Family Medicine

## 2013-05-11 ENCOUNTER — Other Ambulatory Visit (HOSPITAL_COMMUNITY)
Admission: RE | Admit: 2013-05-11 | Discharge: 2013-05-11 | Disposition: A | Discharge status: Home / Self Care | Payer: Medicaid Other | Source: Ambulatory Visit | Attending: Family Medicine | Admitting: Family Medicine

## 2013-05-11 ENCOUNTER — Encounter: Payer: Self-pay | Admitting: Family Medicine

## 2013-05-11 VITALS — BP 121/85 | HR 104 | Temp 98.7°F | Ht 65.0 in | Wt 194.0 lb

## 2013-05-11 DIAGNOSIS — N920 Excessive and frequent menstruation with regular cycle: Secondary | ICD-10-CM

## 2013-05-11 DIAGNOSIS — E1165 Type 2 diabetes mellitus with hyperglycemia: Secondary | ICD-10-CM

## 2013-05-11 DIAGNOSIS — Z124 Encounter for screening for malignant neoplasm of cervix: Secondary | ICD-10-CM

## 2013-05-11 DIAGNOSIS — Z01419 Encounter for gynecological examination (general) (routine) without abnormal findings: Secondary | ICD-10-CM | POA: Insufficient documentation

## 2013-05-11 DIAGNOSIS — Z1151 Encounter for screening for human papillomavirus (HPV): Secondary | ICD-10-CM | POA: Insufficient documentation

## 2013-05-11 DIAGNOSIS — IMO0001 Reserved for inherently not codable concepts without codable children: Secondary | ICD-10-CM

## 2013-05-11 DIAGNOSIS — IMO0002 Reserved for concepts with insufficient information to code with codable children: Secondary | ICD-10-CM

## 2013-05-11 LAB — COMPREHENSIVE METABOLIC PANEL
ALK PHOS: 52 U/L (ref 39–117)
ALT: 16 U/L (ref 0–35)
AST: 16 U/L (ref 0–37)
Albumin: 4 g/dL (ref 3.5–5.2)
BUN: 6 mg/dL (ref 6–23)
CO2: 24 mEq/L (ref 19–32)
Calcium: 9.2 mg/dL (ref 8.4–10.5)
Chloride: 101 mEq/L (ref 96–112)
Creat: 0.83 mg/dL (ref 0.50–1.10)
Glucose, Bld: 212 mg/dL — ABNORMAL HIGH (ref 70–99)
Potassium: 4 mEq/L (ref 3.5–5.3)
Sodium: 136 mEq/L (ref 135–145)
Total Bilirubin: 0.9 mg/dL (ref 0.2–1.2)
Total Protein: 6.7 g/dL (ref 6.0–8.3)

## 2013-05-11 LAB — LIPID PANEL
CHOL/HDL RATIO: 6.8 ratio
CHOLESTEROL: 150 mg/dL (ref 0–200)
HDL: 22 mg/dL — ABNORMAL LOW (ref >39)
LDL Cholesterol: 75 mg/dL (ref 0–99)
Triglycerides: 264 mg/dL — ABNORMAL HIGH (ref <150)
VLDL: 53 mg/dL — AB (ref 0–40)

## 2013-05-11 LAB — POCT GLYCOSYLATED HEMOGLOBIN (HGB A1C): Hemoglobin A1C: 8.9

## 2013-05-11 MED ORDER — METFORMIN HCL ER 750 MG PO TB24: 1500.0000 mg | ORAL_TABLET | Freq: Every day | ORAL | Status: DC

## 2013-05-11 NOTE — Progress Notes (Signed)
   Subjective:    Patient ID: Sara Brock, female    DOB: 01-Jun-1980, 33 y.o.   MRN: [id number]  HPI 33 yo F presents for physical and to f/u diabetes:  1. CHRONIC DIABETES  Disease Monitoring  Blood Sugar Ranges: does not check   Polyuria: no   Visual problems: no   Medication Compliance: yes  Medication Side Effects  Hypoglycemia: no   Preventitive Health Care  Eye Exam: done 11/14     Foot Exam: done today  Diet pattern: eats 3 meals daily. Varies. Not consistently low carb.   Exercise: irregular.   2. HM: due for pap smear  3. Menses: she would like to stop them. She feels they are heavy and unnecessary. She has tubal for contraception. Menses are regular, occur every 28 days. Last 4-7 days. Associated with cramping and heavy flow.   Soc hx: non smoker  Review of Systems As per HPI     Objective:   Physical Exam BP 121/85  Pulse 104  Temp(Src) 98.7 F (37.1 C) (Oral)  Ht 5\' 5"  (1.651 m)  Wt 194 lb (87.998 kg)  BMI 32.28 kg/m2  LMP 04/27/2013 General appearance: alert, cooperative and no distress Throat: lips, mucosa, and tongue normal; teeth and gums normal Neck: no adenopathy, no carotid bruit, no JVD, supple, symmetrical, trachea midline and thyroid not enlarged, symmetric, no tenderness/mass/nodules Lungs: clear to auscultation bilaterally Heart: regular rate and rhythm, S1, S2 normal, no murmur, click, rub or gallop Abdomen: soft, non-tender; bowel sounds normal; no masses,  no organomegaly Pelvic: cervix normal in appearance, external genitalia normal, no adnexal masses or tenderness, no cervical motion tenderness, rectovaginal septum normal and uterus normal size, shape, and consistency Extremities: extremities normal, atraumatic, no cyanosis or edema Skin: Skin color, texture, turgor normal. No rashes or lesions   Lab Results  Component Value Date   HGBA1C 8.9 05/11/2013   Pap done today     Assessment & Plan:

## 2013-05-11 NOTE — Patient Instructions (Addendum)
[name],   Thank you for coming in today.  Your A1c is up from previous check.  I will be in touch with bloodwork and pap smear results.   Please try NSAIDs prior periods in the following manner: Ibuprofen 600 mg TID with meals for the first 2 days prior to your period.   F/u in 3 months for diabetes f/u.   F/u with Dr. Deirdre Priesthambliss.

## 2013-05-12 LAB — MICROALBUMIN / CREATININE URINE RATIO
Creatinine, Urine: 363.5 mg/dL
Microalb Creat Ratio: 3.4 mg/g (ref 0.0–30.0)
Microalb, Ur: 1.24 mg/dL (ref 0.00–1.89)

## 2013-05-14 DIAGNOSIS — N920 Excessive and frequent menstruation with regular cycle: Secondary | ICD-10-CM | POA: Insufficient documentation

## 2013-05-14 NOTE — Assessment & Plan Note (Signed)
A: patient with reported menorrhagia. Does not desire contraception to lessen menses.  P: Trial of oral NSAID 2 days prior to and during menses.

## 2013-05-14 NOTE — Assessment & Plan Note (Addendum)
A: slightly up from last check P:  Increase metformin Exercise Low carb diet Check lipids likely start statin. Check urine P/Cr likely start ACE i.  Check CMP F/u in 3 months with PCP

## 2013-05-15 ENCOUNTER — Telehealth: Payer: Self-pay | Admitting: Family Medicine

## 2013-05-15 ENCOUNTER — Encounter: Payer: Self-pay | Admitting: Family Medicine

## 2013-05-15 MED ORDER — ATORVASTATIN CALCIUM 40 MG PO TABS: 40.0000 mg | ORAL_TABLET | Freq: Every day | ORAL | Status: DC

## 2013-05-15 NOTE — Telephone Encounter (Signed)
Called patient. Reviewed labs. Told her about results letter. She is amenable to starting statin therapy.   Patient mentioned anxiety and school related stress. She became tearful on the phone.  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 I also recommended that she call in for f/u appt with myself or Dr. Deirdre Brock to discuss in detail.

## 2013-06-26 ENCOUNTER — Ambulatory Visit (INDEPENDENT_AMBULATORY_CARE_PROVIDER_SITE_OTHER): Payer: Medicaid Other | Admitting: Emergency Medicine

## 2013-06-26 ENCOUNTER — Encounter: Payer: Self-pay | Admitting: Emergency Medicine

## 2013-06-26 VITALS — BP 105/71 | HR 91 | Temp 98.3°F | Resp 16 | Ht 64.0 in | Wt 194.8 lb

## 2013-06-26 DIAGNOSIS — IMO0002 Reserved for concepts with insufficient information to code with codable children: Secondary | ICD-10-CM

## 2013-06-26 DIAGNOSIS — E1165 Type 2 diabetes mellitus with hyperglycemia: Secondary | ICD-10-CM

## 2013-06-26 DIAGNOSIS — B379 Candidiasis, unspecified: Secondary | ICD-10-CM

## 2013-06-26 DIAGNOSIS — R350 Frequency of micturition: Secondary | ICD-10-CM

## 2013-06-26 DIAGNOSIS — IMO0001 Reserved for inherently not codable concepts without codable children: Secondary | ICD-10-CM

## 2013-06-26 LAB — POCT URINALYSIS DIPSTICK
BILIRUBIN UA: NEGATIVE
Glucose, UA: 500
Ketones, UA: 15
Leukocytes, UA: NEGATIVE
NITRITE UA: NEGATIVE
Protein, UA: NEGATIVE
RBC UA: NEGATIVE
Spec Grav, UA: >=1.03
Urobilinogen, UA: 0.2
pH, UA: 6

## 2013-06-26 LAB — GLUCOSE, CAPILLARY: Glucose-Capillary: 260 mg/dL — ABNORMAL HIGH (ref 70–99)

## 2013-06-26 MED ORDER — GLIPIZIDE 5 MG PO TABS: 5.0000 mg | ORAL_TABLET | Freq: Every day | ORAL | Status: DC

## 2013-06-26 MED ORDER — FLUCONAZOLE 150 MG PO TABS: 150.0000 mg | ORAL_TABLET | Freq: Once | ORAL | Status: DC

## 2013-06-26 NOTE — Progress Notes (Signed)
Patient presents same day for 2 week history of fatigue, urinary frequency and low back pain States blood sugars have been 300-400 over last 4 days. C/O low back pain on left at present.

## 2013-06-26 NOTE — Assessment & Plan Note (Signed)
Unclear cause for acute elevation of blood sugars. This could just be natural progression of the disease. Continue current dose of metformin. Start glipizide 5 mg daily with breakfast. Have reviewed side effects of this medication, including hypoglycemia. She is to followup in 2 weeks if her sugars have not improved.

## 2013-06-26 NOTE — Assessment & Plan Note (Signed)
History consistent with this. Diflucan 150 mg x1.  Repeat dose if symptoms persist after one week.

## 2013-06-26 NOTE — Patient Instructions (Signed)
It was nice to see you!  I'm not sure why your sugars suddenly went up.  It is not uncommon for people to gradually need more medication to control their blood sugar the longer they have diabetes. Continue the metformin 2 pills at bedtime. Start glipizide 5mg  with breakfast.  Glipizide can make your sugar drop too low so ALWAYS take it with food.  Signs of low blood sugar include shakiness, jittery, sweating, feeling confused.  If you have any of these symptoms, please check your blood sugar.  If it is <80, please eat a snack (like m&ms or some orange juice) to raise your blood sugar.  You also likely have a yeast infection.   Take Diflucan 1 pill today.  If the itching is still there is 1 week, take the 2nd pill.  Follow up in about 2 weeks to see how things are going.  If your sugars are back to normal (120-200), you can cancel this appointment.

## 2013-06-26 NOTE — Progress Notes (Signed)
   Subjective:    Patient ID: Sara Brock, female    DOB: 1981/01/09, 33 y.o.   MRN: [id number]  HPI Sara Brock is here for a same-day appointment for high blood sugars.  She states for the last week or so she has had increased fatigue, polyuria, polydipsia, and blurred vision. She picked up some strips to check her glucose and it was greater than 400 2 days ago. It has gradually trended down since then, her last value at home was around 330.  She denies any changes in her diet. She does report a lot of stress, however this is not new. Denies any recent illnesses. No dysuria, cough, nausea, vomiting. She does have some diarrhea after taking metformin, but this is not new. She does report some vaginal irritation and itching. Denies any vaginal discharge.  She is currently taking metformin XR 1500 milligrams daily.  Current Outpatient Prescriptions on File Prior to Visit  Medication Sig Dispense Refill  . atorvastatin (LIPITOR) 40 MG tablet Take 1 tablet (40 mg total) by mouth daily at 6 PM.  90 tablet  3  . metFORMIN (GLUCOPHAGE XR) 750 MG 24 hr tablet Take 2 tablets (1,500 mg total) by mouth daily after supper.  60 tablet  3  . traZODone (DESYREL) 50 MG tablet Take 1 tablet (50 mg total) by mouth at bedtime as needed for sleep.  30 tablet  0   No current facility-administered medications on file prior to visit.    I have reviewed and updated the following as appropriate: allergies and current medications SHx: non smoker   Review of Systems See HPI    Objective:   Physical Exam BP 105/71  Pulse 91  Temp(Src) 98.3 F (36.8 C) (Oral)  Resp 16  Ht 5\' 4"  (1.626 m)  Wt 194 lb 12.8 oz (88.361 kg)  BMI 33.42 kg/m2  SpO2 98% Gen: alert, cooperative, NAD, looks tired HEENT: AT/[place], sclera white, MMM Neck: supple CV: RRR, no murmurs Pulm: CTAB, no wheezes or rales Abd: +BS, soft, NTND  UA: normal except for 500 glucose CBG: 260     Assessment & Plan:

## 2013-06-28 ENCOUNTER — Encounter: Payer: Self-pay | Admitting: Family Medicine

## 2013-06-28 ENCOUNTER — Ambulatory Visit (INDEPENDENT_AMBULATORY_CARE_PROVIDER_SITE_OTHER): Payer: Medicaid Other | Admitting: Family Medicine

## 2013-06-28 VITALS — BP 120/61 | HR 80 | Temp 98.5°F | Ht 65.0 in | Wt 194.0 lb

## 2013-06-28 DIAGNOSIS — E1165 Type 2 diabetes mellitus with hyperglycemia: Secondary | ICD-10-CM

## 2013-06-28 DIAGNOSIS — IMO0002 Reserved for concepts with insufficient information to code with codable children: Secondary | ICD-10-CM

## 2013-06-28 DIAGNOSIS — IMO0001 Reserved for inherently not codable concepts without codable children: Secondary | ICD-10-CM

## 2013-06-28 LAB — GLUCOSE, CAPILLARY: Glucose-Capillary: 226 mg/dL — ABNORMAL HIGH (ref 70–99)

## 2013-06-28 NOTE — Patient Instructions (Signed)
How to Increase Fiber in the Meal Plan for Diabetes Increasing fiber in the diet has many benefits including lowering blood cholesterol, helping to control blood glucose (sugar), preventing constipation, and aiding in weight management by helping you feel full longer. Start adding fiber to your diet slowly. A gradual substitution of Milillo-fiber foods for low-fiber foods will allow the digestive tract to adjust. Most men under 35 years of age should aim to eat 38 g of fiber a day. Women should aim for 25 g. Over 35 years of age, most men need 30 g of fiber and most women need 21 g. Below are some suggestions for increasing fiber.  Try whole-wheat bread instead of white bread. Look for words Glazier on the list of ingredients, such as whole wheat, whole rye, or whole oats.  Try a baked potato with skin instead of mashed potatoes.  Try a fresh apple with skin instead of applesauce.  Try a fresh orange instead of orange juice.  Try popcorn instead of potato chips.  Try bran cereal instead of corn flakes.  Try kidney, whole pinto, or garbanzo beans instead of bread.  Try whole-grain crackers instead of saltine crackers.  Try whole-wheat pasta instead of regular varieties. While on a Bossman-fiber diet:   Drink enough water and fluids to keep your urine clear or pale yellow.  Eat a variety of Schoeppner fiber foods such as fruits, vegetables, whole grains, nuts, and seeds.  Aim for 5 servings of fruit and vegetables per day.  Try to increase your intake of fiber by eating Hunzeker-fiber foods instead of taking fiber supplements that contain small amounts of fiber. There can be additional benefits for long-term health and blood glucose control with Beegle-fiber foods.  SOURCES OF FIBER The following list shows the average dietary fiber for types of food in the various food groups. Starches and Breads / Dietary Fiber (g)  Whole-grain bread, 1 slice / 2 g  Whole-grain cereals,  cup / 3 g  Bran cereals,   to  cup / 8 g  Starchy vegetables,  cup / 3 g  Oatmeal,  cup / 2 g  Whole-wheat pasta,  cup / 2 g  Brown rice,  cup / 2 g  Barley,  cup / 3 g Legumes / Dietary Fiber (g)  Beans,  cup / 8 g  Peas,  cup / 8 g  Lentils ,  cup / 8 g Meat and Meat Substitutes / Dietary Fiber (g) This group averages 0 grams of fiber. Exceptions are:  Nuts, seeds, 1 oz or  cup / 3 g  Chunky peanut butter, 2 tbs / 3 g Vegetables / Dietary Fiber (g)  Cooked vegetables,  to  cup / 2 to 3 g  Raw vegetables, 1 to 2 cups / 2 to 3 g Fruit / Dietary Fiber (g)  Raw or cooked fruit,  cup or 1 small, fresh piece / 2 g Milk / Dietary Fiber (g)  Milk, 1 cup or 8 oz / 0 g Fats and Oils / Dietary Fiber (g)  Fats and oils, 1 tsp / 0 g You can determine how much fiber you are eating by reading the Nutrition Facts panel on the labels of the foods you eat. FIBER IN SPECIFIC FOODS Cereals / Dietary Fiber (g)  All Bran,  cup / 9 g  All Bran with Extra Fiber,  cup / 13 g  Bran Flakes,  cup / 4 g  Cheerios,  cup / 1.5 g    Corn Bran,  cup / 4 g  Corn Flakes,  cup / 0.75 g  Cracklin' Oat Bran,  cup / 4 g  Fiber One,  cup / 13 g  Grape Nuts, 3 tbs / 3 g  Grape Nuts Flakes,  cup / 3 g  Noodles,  cup, cooked / 0.5 g  Nutrigrain Wheat,  cup / 3.5 g  Oatmeal,  cup, cooked / 1.1 g  Pasta, white (macaroni, spaghetti),  cup, cooked / 0.5 g  Pasta, whole-wheat (macaroni, spaghetti),  cup, cooked / 2 g  Ralston,  cup, cooked / 3 g  Rice, wild,  cup, cooked / 0.5 g  Rice, brown,  cup, cooked / 1 g  Rice, white,  cup, cooked / 0.2 g  Shredded Wheat, bite-sized,  cup / 2 g  Total,  cup / 1.75 g  Wheat Chex,  cup / 2.5 g  Wheatena,  cup, cooked / 4 g  Wheaties,  cup / 2.75 g Bread, Starchy Vegetables, and Dried Peas and Beans / Dietary Fiber (g)  Bagel, whole / 0.6 g  Baked beans in tomato sauce,  cup, cooked / 3 g  Bran muffin, 1 small  / 2.5 g  Bread, cracked wheat, 1 slice / 2.5 g  Bread, pumpernickel, 1 slice / 2.5 g  Bread, white, 1 slice / 0.4 g  Bread, whole-wheat, 1 slice / 1.4 g  Corn,  cup, canned / 2.9 g  Kidney beans,  cup, cooked / 3.5 g  Lentils, cup, cooked / 3 g  Lima beans,  cup, cooked / 4 g  Navy beans,  cup, cooked / 4 g  Peas,  cup, cooked / 4 g  Popcorn, 3 cups popped, unbuttered / 3.5 g  Potato, baked (with skin), 1 small / 4 g  Potato, baked (without skin), 1 small / 2 g  Ry-Krisp, 4 crackers / 3 g  Saltine crackers, 6 squares / 0 g  Split peas,  cup, cooked / 2.5 g  Yams (sweet potato),  cup / 1.7 g Fruit / Dietary Fiber (g)  Apple, 1 small, fresh, with skin / 4 g  Apple juice,  cup / 0.4 g  Apricots, 4 medium, fresh / 4 g  Apricots, 7 halves, dried / 2 g  Banana,  medium / 1.2 g  Blueberries,  cup / 2 g  Cantaloupe, melon / 1.3 g  Cherries,  cup, canned / 1.4 g  Grapefruit,  medium / 1.6 g  Grapes, 15 small / 1.2 g  Grape juice,  cup / 0.5 g  Orange, 1 medium, fresh / 2 g  Orange juice,  cup / 0.5 g  Peach, 1 medium,fresh, with skin / 2 g  Pear, 1 medium, fresh, with skin / 4 g  Pineapple, cup, canned / 0.7 g  Plums, 2 whole / 2 g  Prunes, 3 whole / 1.5 g  Raspberries, 1 cup / 6 g  Strawberries, 1  cup / 4 g  Watermelon, 1  cup / 0.5 g Vegetables / Dietary Fiber (g)  Asparagus,  cup, cooked / 1 g  Beans, green and wax,  cup, cooked / 1.6 g  Beets,  cup, cooked / 1.8 g  Broccoli,  cup, cooked / 2.2 g  Brussels sprouts,  cup, cooked / 4 g  Cabbage,  cup, cooked / 2.5 g  Carrots,  cup, cooked / 2.3 g  Cauliflower,  cup, cooked / 1.1 g  Celery, 1 cup, raw /   1.5 g  Cucumber, 1 cup, raw / 0.8 g  Green pepper,  cup sliced, cooked / 1.5 g  Lettuce, 1 cup, sliced / 0.9 g  Mushrooms, 1 cup sliced, raw / 1.8 g  Onion, 1 cup sliced, raw / 1.6 g  Spinach,  cup, cooked / 2.4 g  Tomato, 1 medium, fresh / 1.5  g  Tomato juice,  cup / 0 g  Zucchini,  cup, cooked / 1.8 g Document Released: 07/10/2001 Document Revised: 05/15/2012 Document Reviewed: 08/06/2008 [employer] Patient Information 2014 [place], [place]. Diabetes and Exercise Exercising regularly is important. It is not just about losing weight. It has many health benefits, such as:  Improving your overall fitness, flexibility, and endurance.  Increasing your bone density.  Helping with weight control.  Decreasing your body fat.  Increasing your muscle strength.  Reducing stress and tension.  Improving your overall health. People with diabetes who exercise gain additional benefits because exercise:  Reduces appetite.  Improves the body's use of blood sugar (glucose).  Helps lower or control blood glucose.  Decreases blood pressure.  Helps control blood lipids (such as cholesterol and triglycerides).  Improves the body's use of the hormone insulin by:  Increasing the body's insulin sensitivity.  Reducing the body's insulin needs.  Decreases the risk for heart disease because exercising:  Lowers cholesterol and triglycerides levels.  Increases the levels of good cholesterol (such as high-density lipoproteins [HDL]) in the body.  Lowers blood glucose levels. YOUR ACTIVITY PLAN  Choose an activity that you enjoy and set realistic goals. Your health care provider or diabetes educator can help you make an activity plan that works for you. You can break activities into 2 or 3 sessions throughout the day. Doing so is as good as one long session. Exercise ideas include:  Taking the dog for a walk.  Taking the stairs instead of the elevator.  Dancing to your favorite song.  Doing your favorite exercise with a friend. RECOMMENDATIONS FOR EXERCISING WITH TYPE 1 OR TYPE 2 DIABETES   Check your blood glucose before exercising. If blood glucose levels are greater than 240 mg/dL, check for urine ketones. Do not exercise if  ketones are present.  Avoid injecting insulin into areas of the body that are going to be exercised. For example, avoid injecting insulin into:  The arms when playing tennis.  The legs when jogging.  Keep a record of:  Food intake before and after you exercise.  Expected peak times of insulin action.  Blood glucose levels before and after you exercise.  The type and amount of exercise you have done.  Review your records with your health care provider. Your health care provider will help you to develop guidelines for adjusting food intake and insulin amounts before and after exercising.  If you take insulin or oral hypoglycemic agents, watch for signs and symptoms of hypoglycemia. They include:  Dizziness.  Shaking.  Sweating.  Chills.  Confusion.  Drink plenty of water while you exercise to prevent dehydration or heat stroke. Body water is lost during exercise and must be replaced.  Talk to your health care provider before starting an exercise program to make sure it is safe for you. Remember, almost any type of activity is better than none. Document Released: 04/10/2003 Document Revised: 09/20/2012 Document Reviewed: 06/27/2012 [facility] Patient Information 2014 [place], [place].

## 2013-06-28 NOTE — Assessment & Plan Note (Addendum)
Continues to have elevated BS, however, dietary changes- will decrease fruit intake and add protein source with consumption, add exercise and will continue give medication time to work

## 2013-06-28 NOTE — Progress Notes (Signed)
    Subjective:    Patient ID: Sara Brock is a 33 y.o. female presenting with Hyperglycemia  on 06/28/2013  HPI: Here on 5/26 with acutely elevated BS.  Started on Glipizide 5 mg daily.  BS is coming down, but remains elevated and she is very nervous about it.  She is scared that she will end up on insulin.  She also has a grandmother on dialysis and this worries her also. Has increased her fruit intake by a lot recently.  Especially grapes and oranges.  Review of Systems  Respiratory: Negative for shortness of breath.   Cardiovascular: Negative for chest pain.  Endocrine: Positive for polydipsia and polyphagia.  Musculoskeletal: Positive for back pain.      Objective:    BP 120/61  Pulse 80  Temp(Src) 98.5 F (36.9 C) (Oral)  Ht 5\' 5"  (1.651 m)  Wt 194 lb (87.998 kg)  BMI 32.28 kg/m2 Physical Exam  Vitals reviewed. Constitutional: She appears well-developed and well-nourished. No distress.  HENT:  Head: Normocephalic and atraumatic.  Cardiovascular: Normal rate.   Pulmonary/Chest: Effort normal.  Abdominal: Soft. There is no tenderness.  Neurological: She is alert.        Assessment & Plan:  DM (diabetes mellitus), type 2, uncontrolled Continues to have elevated BS, however, dietary changes- will decrease fruit intake and add protein source with consumption, add exercise and will continue give medication time to work    Return in about 2 weeks (around 07/12/2013) for a follow-up.

## 2013-07-10 ENCOUNTER — Emergency Department (HOSPITAL_COMMUNITY)
Admission: EM | Admit: 2013-07-10 | Discharge: 2013-07-10 | Disposition: A | Discharge status: Home / Self Care | Payer: Medicaid Other | Attending: Emergency Medicine | Admitting: Emergency Medicine

## 2013-07-10 ENCOUNTER — Emergency Department (HOSPITAL_COMMUNITY): Payer: Medicaid Other

## 2013-07-10 ENCOUNTER — Encounter (HOSPITAL_COMMUNITY): Payer: Self-pay | Admitting: Emergency Medicine

## 2013-07-10 DIAGNOSIS — Z3202 Encounter for pregnancy test, result negative: Secondary | ICD-10-CM | POA: Insufficient documentation

## 2013-07-10 DIAGNOSIS — E119 Type 2 diabetes mellitus without complications: Secondary | ICD-10-CM | POA: Insufficient documentation

## 2013-07-10 DIAGNOSIS — Z79899 Other long term (current) drug therapy: Secondary | ICD-10-CM | POA: Insufficient documentation

## 2013-07-10 DIAGNOSIS — Z872 Personal history of diseases of the skin and subcutaneous tissue: Secondary | ICD-10-CM | POA: Insufficient documentation

## 2013-07-10 DIAGNOSIS — J069 Acute upper respiratory infection, unspecified: Secondary | ICD-10-CM

## 2013-07-10 LAB — CBC WITH DIFFERENTIAL/PLATELET
BASOS ABS: 0 10*3/uL (ref 0.0–0.1)
Basophils Relative: 0 % (ref 0–1)
Eosinophils Absolute: 0.2 10*3/uL (ref 0.0–0.7)
Eosinophils Relative: 3 % (ref 0–5)
HEMATOCRIT: 38.7 % (ref 36.0–46.0)
HEMOGLOBIN: 12.7 g/dL (ref 12.0–15.0)
LYMPHS PCT: 24 % (ref 12–46)
Lymphs Abs: 1.5 10*3/uL (ref 0.7–4.0)
MCH: 24.5 pg — ABNORMAL LOW (ref 26.0–34.0)
MCHC: 32.8 g/dL (ref 30.0–36.0)
MCV: 74.7 fL — ABNORMAL LOW (ref 78.0–100.0)
MONO ABS: 0.4 10*3/uL (ref 0.1–1.0)
Monocytes Relative: 6 % (ref 3–12)
Neutro Abs: 4.3 10*3/uL (ref 1.7–7.7)
Neutrophils Relative %: 67 % (ref 43–77)
Platelets: 174 10*3/uL (ref 150–400)
RBC: 5.18 MIL/uL — ABNORMAL HIGH (ref 3.87–5.11)
RDW: 14.3 % (ref 11.5–15.5)
WBC: 6.4 10*3/uL (ref 4.0–10.5)

## 2013-07-10 LAB — BASIC METABOLIC PANEL
BUN: 6 mg/dL (ref 6–23)
CHLORIDE: 96 meq/L (ref 96–112)
CO2: 25 meq/L (ref 19–32)
CREATININE: 0.68 mg/dL (ref 0.50–1.10)
Calcium: 9.5 mg/dL (ref 8.4–10.5)
GFR calc Af Amer: >90 mL/min (ref >90)
GFR calc non Af Amer: >90 mL/min (ref >90)
Glucose, Bld: 169 mg/dL — ABNORMAL HIGH (ref 70–99)
POTASSIUM: 3.7 meq/L (ref 3.7–5.3)
Sodium: 138 mEq/L (ref 137–147)

## 2013-07-10 LAB — URINALYSIS, ROUTINE W REFLEX MICROSCOPIC
Bilirubin Urine: NEGATIVE
GLUCOSE, UA: NEGATIVE mg/dL
Hgb urine dipstick: NEGATIVE
KETONES UR: NEGATIVE mg/dL
Leukocytes, UA: NEGATIVE
Nitrite: NEGATIVE
PH: 7.5 (ref 5.0–8.0)
Protein, ur: NEGATIVE mg/dL
Specific Gravity, Urine: 1.021 (ref 1.005–1.030)
Urobilinogen, UA: 1 mg/dL (ref 0.0–1.0)

## 2013-07-10 LAB — CBG MONITORING, ED: Glucose-Capillary: 170 mg/dL — ABNORMAL HIGH (ref 70–99)

## 2013-07-10 LAB — PREGNANCY, URINE: PREG TEST UR: NEGATIVE

## 2013-07-10 LAB — RAPID STREP SCREEN (MED CTR MEBANE ONLY): Streptococcus, Group A Screen (Direct): NEGATIVE

## 2013-07-10 MED ORDER — SODIUM CHLORIDE 0.9 % IV BOLUS (SEPSIS)
1000.0000 mL | Freq: Once | INTRAVENOUS | Status: AC
  Administered 2013-07-10: 1000 mL via INTRAVENOUS

## 2013-07-10 MED ORDER — KETOROLAC TROMETHAMINE 30 MG/ML IJ SOLN
30.0000 mg | Freq: Once | INTRAMUSCULAR | Status: AC
  Administered 2013-07-10: 30 mg via INTRAVENOUS
  Filled 2013-07-10: qty 1

## 2013-07-10 NOTE — Discharge Instructions (Signed)

## 2013-07-10 NOTE — ED Notes (Signed)
Blood sugars running high (400's) last few days, sees Dr. Ave Filter, c/o throat pain and headache since yesterday.  Coughing as well. Throat red.  Generalized aches and fatigue.  Denies abdominal pain, denies dysuria, denies diarrhea

## 2013-07-10 NOTE — ED Notes (Signed)
Pt. Returned from xray 

## 2013-07-10 NOTE — ED Notes (Addendum)
Pt presents with multiple complaints, pt states she began feeling dizzy at 1030 this am, fatigued x3 days, elevated blood sugars x2 weeks, pt has followed up with her PCP for Hyperglycemic with no change. Pt prescribed new prescription with no relief. Pt also reports runny nose and sore throat x2 days. Pt ambulatory in triage

## 2013-07-11 NOTE — ED Provider Notes (Signed)
CSN: [id number]     Arrival date & time 07/10/13  1627 History   First MD Initiated Contact with Patient 07/10/13 1811     Chief Complaint  Patient presents with  . Dizziness  . Fatigue  . Sore Throat  . Hyperglycemia     (Consider location/radiation/quality/duration/timing/severity/associated sxs/prior Treatment) Patient is a 33 y.o. female presenting with dizziness, pharyngitis, and hyperglycemia. The history is provided by the patient.  Dizziness Quality:  Lightheadedness Severity:  Mild Onset quality:  Gradual Timing:  Intermittent Progression:  Worsening Chronicity:  New Relieved by:  Nothing Worsened by:  Nothing tried Associated symptoms: weakness   Associated symptoms: no chest pain, no headaches, no nausea, no shortness of breath and no vision changes   Sore Throat Pertinent negatives include no chest pain, no headaches and no shortness of breath.  Hyperglycemia Associated symptoms: dizziness, fatigue and weakness   Associated symptoms: no chest pain, no fever, no nausea and no shortness of breath     Past Medical History  Diagnosis Date  . Abscess   . Diabetes mellitus   . Seasonal allergies    Past Surgical History  Procedure Laterality Date  . Tubal ligation     Family History  Problem Relation Age of Onset  . Hypertension Mother   . Hyperlipidemia Mother    History  Substance Use Topics  . Smoking status: Never Smoker   . Smokeless tobacco: Not on file  . Alcohol Use: No   OB History   Grav Para Term Preterm Abortions TAB SAB Ect Mult Living                 Review of Systems  Constitutional: Positive for chills and fatigue. Negative for fever.  HENT: Positive for rhinorrhea and sore throat.   Respiratory: Positive for cough. Negative for shortness of breath.   Cardiovascular: Negative for chest pain.  Gastrointestinal: Negative for nausea.  Neurological: Positive for dizziness. Negative for headaches.  All other systems reviewed and are  negative.     Allergies  Review of patient's allergies indicates no known allergies.  Home Medications   Prior to Admission medications   Medication Sig Start Date End Date Taking? Authorizing Provider  atorvastatin (LIPITOR) 40 MG tablet Take 1 tablet (40 mg total) by mouth daily at 6 PM. 05/15/13  Yes Josalyn C Funches, MD  glipiZIDE (GLUCOTROL) 5 MG tablet Take 1 tablet (5 mg total) by mouth daily before breakfast. 06/26/13  Yes Charm RingsErin J Honig, MD  metFORMIN (GLUCOPHAGE XR) 750 MG 24 hr tablet Take 2 tablets (1,500 mg total) by mouth daily after supper. 05/11/13  Yes Josalyn C Funches, MD  traZODone (DESYREL) 50 MG tablet Take 1 tablet (50 mg total) by mouth at bedtime as needed for sleep. 08/16/12  Yes Carney LivingMarshall L Chambliss, MD   BP 104/73  Pulse 92  Temp(Src) 99.6 F (37.6 C) (Oral)  Resp 18  Ht 5\' 4"  (1.626 m)  Wt 194 lb (87.998 kg)  BMI 33.28 kg/m2  SpO2 99%  LMP 06/23/2013 Physical Exam  Nursing note and vitals reviewed. Constitutional: She is oriented to person, place, and time. She appears well-developed and well-nourished. No distress.  HENT:  Head: Normocephalic and atraumatic.  Right Ear: Tympanic membrane normal.  Left Ear: Tympanic membrane normal.  Mouth/Throat: No oropharyngeal exudate (cobblestoning of posterior pharynx).  Eyes: EOM are normal. Pupils are equal, round, and reactive to light.  Neck: Normal range of motion. Neck supple.  Cardiovascular: Normal rate and regular  rhythm.  Exam reveals no friction rub.   No murmur heard. Pulmonary/Chest: Effort normal and breath sounds normal. No respiratory distress. She has no wheezes. She has no rales.  Abdominal: Soft. She exhibits no distension. There is no tenderness. There is no rebound.  Musculoskeletal: Normal range of motion. She exhibits no edema.  Neurological: She is alert and oriented to person, place, and time.  Skin: She is not diaphoretic.    ED Course  Procedures (including critical care  time) Labs Review Labs Reviewed  CBC WITH DIFFERENTIAL - Abnormal; Notable for the following:    RBC 5.18 (*)    MCV 74.7 (*)    MCH 24.5 (*)    All other components within normal limits  BASIC METABOLIC PANEL - Abnormal; Notable for the following:    Glucose, Bld 169 (*)    All other components within normal limits  URINALYSIS, ROUTINE W REFLEX MICROSCOPIC - Abnormal; Notable for the following:    APPearance CLOUDY (*)    All other components within normal limits  CBG MONITORING, ED - Abnormal; Notable for the following:    Glucose-Capillary 170 (*)    All other components within normal limits  RAPID STREP SCREEN  CULTURE, GROUP A STREP  PREGNANCY, URINE    Imaging Review Dg Chest 2 View  07/10/2013   CLINICAL DATA:  Productive cough for 3 days.  EXAM: CHEST  2 VIEW  COMPARISON:  04/05/2012.  FINDINGS: Normal cardiac and mediastinal silhouette. Clear lung fields. No bony abnormality.  IMPRESSION: No active cardiopulmonary disease.  Stable chest from priors.   Electronically Signed   By: Davonna Belling M.D.   On: 07/10/2013 19:59     EKG Interpretation None      MDM   Final diagnoses:  URI (upper respiratory infection)    9F here with some dizziness, rhinorrhea, cough, sore throat. Also states some hyperglycemia lately. Mild productivity to her cough with some generalized fatigue. Chills but no documented fever. Here mildly tachycardic, no respiratory distress. Lungs with occaisonal mild wheeze, no decreased air movement. Belly benign. TMs clear. Throat with cobblestoning, no erythema or discharge.  Likely URI. CXR clear, no PNA. Improving after fluids. Sugar ok here, no signs of DKA on labs. Feeling better after Toradol. Stable for discharge.  Dagmar Hait, MD 07/11/13 1328

## 2013-07-12 LAB — CULTURE, GROUP A STREP

## 2013-09-06 ENCOUNTER — Telehealth: Payer: Self-pay | Admitting: Family Medicine

## 2013-09-07 ENCOUNTER — Ambulatory Visit (INDEPENDENT_AMBULATORY_CARE_PROVIDER_SITE_OTHER): Payer: Medicaid Other | Admitting: Family Medicine

## 2013-09-07 ENCOUNTER — Encounter: Payer: Self-pay | Admitting: Family Medicine

## 2013-09-07 VITALS — BP 117/74 | HR 83 | Temp 98.0°F | Wt 191.0 lb

## 2013-09-07 DIAGNOSIS — N898 Other specified noninflammatory disorders of vagina: Secondary | ICD-10-CM

## 2013-09-07 MED ORDER — FLUCONAZOLE 150 MG PO TABS: 150.0000 mg | ORAL_TABLET | Freq: Once | ORAL | Status: DC

## 2013-09-07 NOTE — Patient Instructions (Signed)

## 2013-09-07 NOTE — Progress Notes (Signed)
Patient ID: Sara Brock, female   DOB: 1980/11/19, 33 y.o.   MRN: [id number]  S:   HPI  The patient is a 33 yo woman that presents today with vaginal discharge for the past two weeks. She reports the discharge is thick, white, and clumpy and looks like cottage cheese. She had vaginal burning/itching for a few days initially, but this has improved. She reports she was out of town for the past month since her grandmother passed away and has not taken her Metformin in a month. She has had yeast infections in the past, and this feels similar. She denies hematuria, dysuria, abdominal pain, pelvic pain, back pain, fevers, and chills. She does report polyuria that she believes is due to her sugars being high. Her last sexual activity was one month ago. This was unprotected. She only has one partner. She is not concerned about STI at this time, and she denies a pelvic exam/ wet prep today.   O:  Physical Exam:  BP 117/74  Pulse 83  Temp(Src) 98 F (36.7 C) (Oral)  Wt 191 lb (86.637 kg) Gen: Well-appearing. NAD.  HEENT: PERRL. Oropharynx normal. MMM.  Cardiac: RRR. No murmurs, rubs, or gallops.  Resp: CTA. No wheezes. No crackles.  Abdomen: Soft. Non-tender. No masses. No CVA tenderness.    A/P:   The patient is a 33 yo woman that presents with thick, white, clumpy discharge and vaginal burning/itching for the past 2 weeks with no dysuria, hematuria, fevers/chills, abdominal tenderness or CVA tenderness on exam. This appears consistent with candidal vaginitis. No signs of UTI at this time. Patient's polyuria is most likely due to elevated blood sugars from stopping Metformin for the past month. Patient is not concerned about STI at this time and denies pelvic/wet prep/GC/chlamydia.   -Prescribe Diflucan  -Re-start Metformin as prescribed  -Patient should return if the symptoms do not improve or worsen   Hampton AbbotAllie Goins, MS3  Upper Level Medical Student Addendum:  I have seen and evaluated  this patient along with Hampton AbbotAllie Goins MS3. I have addended the above note with pertinent changes. Please see my note below for full documentation.   Sara Brock is a 33 y.o. female who presents today for same day appointment.  Vaginal discharge: has noted thick white discharge for the past week. States this looks like cottage cheese. Noted some itching and burning initially though this has improved. She notes she has not taken her metformin in the past month due to being out of town for her grandmothers funeral, so her sugar has probably been higher leading to the yeast infection. Her last sexual contact was 1 month ago and was unprotected. Has one partner. She declines testing by wet prep and GC/chlamydia today. Denies hematuria, dysuria, abdominal pain, pelvic pain, back pain, fevers, and chills. She does not some polyuria which she believes is related to her not taking her metformin.  Patient is a nonsmoker.   ROS: Per HPI   Physical Exam Filed Vitals:   09/07/13 1040  BP: 117/74  Pulse: 83  Temp: 98 F (36.7 C)    Gen: Well NAD HEENT: PERRL,  MMM Lungs: CTABL Nl WOB Heart: RRR no MRG Abd: soft, NT, ND Exts: Non edematous BL  LE, warm and well perfused.    Assessment/Plan: Please see individual problem list.  Marikay AlarEric Sonnenberg, MD Family Medicine PGY-3

## 2013-09-08 DIAGNOSIS — N898 Other specified noninflammatory disorders of vagina: Secondary | ICD-10-CM | POA: Insufficient documentation

## 2013-09-08 NOTE — Assessment & Plan Note (Signed)
Patient with signs of yeast infection. I discussed testing for additional causes of this discharge, though the patient declined this at this time, opting for empiric treatment. Will treat with diflucan 150 mg PO x1 dose. Advised to start taking metformin again. Given return precautions. F/u if not improving.
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
## 2013-12-17 ENCOUNTER — Other Ambulatory Visit: Payer: Self-pay | Admitting: Family Medicine

## 2013-12-17 ENCOUNTER — Other Ambulatory Visit (INDEPENDENT_AMBULATORY_CARE_PROVIDER_SITE_OTHER): Payer: Medicaid Other

## 2013-12-17 DIAGNOSIS — IMO0002 Reserved for concepts with insufficient information to code with codable children: Secondary | ICD-10-CM

## 2013-12-17 DIAGNOSIS — E1165 Type 2 diabetes mellitus with hyperglycemia: Secondary | ICD-10-CM

## 2013-12-17 LAB — POCT GLYCOSYLATED HEMOGLOBIN (HGB A1C): Hemoglobin A1C: 8.6

## 2013-12-17 NOTE — Progress Notes (Signed)
A1c- 8.6%

## 2014-03-26 ENCOUNTER — Encounter (HOSPITAL_COMMUNITY): Payer: Self-pay | Admitting: Emergency Medicine

## 2014-03-26 ENCOUNTER — Emergency Department (HOSPITAL_COMMUNITY): Payer: Medicaid Other

## 2014-03-26 ENCOUNTER — Emergency Department (HOSPITAL_COMMUNITY)
Admission: EM | Admit: 2014-03-26 | Discharge: 2014-03-27 | Disposition: A | Discharge status: Home / Self Care | Payer: Medicaid Other | Attending: Emergency Medicine | Admitting: Emergency Medicine

## 2014-03-26 DIAGNOSIS — R61 Generalized hyperhidrosis: Secondary | ICD-10-CM | POA: Diagnosis not present

## 2014-03-26 DIAGNOSIS — R0602 Shortness of breath: Secondary | ICD-10-CM | POA: Insufficient documentation

## 2014-03-26 DIAGNOSIS — E119 Type 2 diabetes mellitus without complications: Secondary | ICD-10-CM | POA: Diagnosis not present

## 2014-03-26 DIAGNOSIS — Z872 Personal history of diseases of the skin and subcutaneous tissue: Secondary | ICD-10-CM | POA: Insufficient documentation

## 2014-03-26 DIAGNOSIS — Z79899 Other long term (current) drug therapy: Secondary | ICD-10-CM | POA: Insufficient documentation

## 2014-03-26 LAB — CBC
HCT: 38.3 % (ref 36.0–46.0)
HEMOGLOBIN: 12.5 g/dL (ref 12.0–15.0)
MCH: 23.8 pg — AB (ref 26.0–34.0)
MCHC: 32.6 g/dL (ref 30.0–36.0)
MCV: 73 fL — ABNORMAL LOW (ref 78.0–100.0)
PLATELETS: 174 10*3/uL (ref 150–400)
RBC: 5.25 MIL/uL — ABNORMAL HIGH (ref 3.87–5.11)
RDW: 14.1 % (ref 11.5–15.5)
WBC: 7 10*3/uL (ref 4.0–10.5)

## 2014-03-26 LAB — BASIC METABOLIC PANEL
Anion gap: 9 (ref 5–15)
BUN: 10 mg/dL (ref 6–23)
CO2: 24 mmol/L (ref 19–32)
CREATININE: 0.96 mg/dL (ref 0.50–1.10)
Calcium: 9.2 mg/dL (ref 8.4–10.5)
Chloride: 101 mmol/L (ref 96–112)
GFR calc Af Amer: 88 mL/min — ABNORMAL LOW (ref >90)
GFR calc non Af Amer: 76 mL/min — ABNORMAL LOW (ref >90)
GLUCOSE: 311 mg/dL — AB (ref 70–99)
Potassium: 3.8 mmol/L (ref 3.5–5.1)
SODIUM: 134 mmol/L — AB (ref 135–145)

## 2014-03-26 LAB — I-STAT TROPONIN, ED: Troponin i, poc: 0 ng/mL (ref 0.00–0.08)

## 2014-03-26 LAB — CBG MONITORING, ED: Glucose-Capillary: 295 mg/dL — ABNORMAL HIGH (ref 70–99)

## 2014-03-26 LAB — BRAIN NATRIURETIC PEPTIDE: B NATRIURETIC PEPTIDE 5: 7 pg/mL (ref 0.0–100.0)

## 2014-03-26 LAB — TROPONIN I: Troponin I: <0.03 ng/mL (ref <0.031)

## 2014-03-26 MED ORDER — SODIUM CHLORIDE 0.9 % IV BOLUS (SEPSIS)
1000.0000 mL | INTRAVENOUS | Status: AC
  Administered 2014-03-26: 1000 mL via INTRAVENOUS

## 2014-03-26 NOTE — ED Provider Notes (Signed)
CSN: [id number]     Arrival date & time 03/26/14  2018 History   First MD Initiated Contact with Patient 03/26/14 2204     Chief Complaint  Patient presents with  . Shortness of Breath   (Consider location/radiation/quality/duration/timing/severity/associated sxs/prior Treatment) HPI   Sara Brock is a 34 yo female presenting with report of shortness of breath on exertion.  She states this first started 4 days ago while she was walking at the mall.  She states her mom noticed she was sweating and looked worn out.  She notes since then every time she has exerted herself with just minor walking, she has become short of breath and diaphoretic.  She is a diabetic but her blood sugars are not well-controlled because the glipizide causes too much diarrhea. She denies recent surgery or trauma, hemoptysis, exogenous estrogen, hx of blood clot or leg swelling. She does [place]. She denies any chest pain during the exertional episodes, fever, cough, nausea or light-headedness.   Past Medical History  Diagnosis Date  . Abscess   . Diabetes mellitus   . Seasonal allergies    Past Surgical History  Procedure Laterality Date  . Tubal ligation     Family History  Problem Relation Age of Onset  . Hypertension Mother   . Hyperlipidemia Mother    History  Substance Use Topics  . Smoking status: Never Smoker   . Smokeless tobacco: Not on file  . Alcohol Use: No   OB History    No data available     Review of Systems  Constitutional: Positive for diaphoresis. Negative for fever and chills.  HENT: Negative for sore throat.   Eyes: Negative for visual disturbance.  Respiratory: Positive for shortness of breath. Negative for cough.   Cardiovascular: Negative for chest pain and leg swelling.  Gastrointestinal: Negative for nausea, vomiting and diarrhea.  Genitourinary: Negative for dysuria.  Musculoskeletal: Negative for myalgias.  Skin:  Negative for rash.  Neurological: Negative for weakness, numbness and headaches.    Allergies  Review of patient's allergies indicates no known allergies.  Home Medications   Prior to Admission medications   Medication Sig Start Date End Date Taking? Authorizing Provider  atorvastatin (LIPITOR) 40 MG tablet Take 1 tablet (40 mg total) by mouth daily at 6 PM. 05/15/13   Josalyn C Funches, MD  divalproex (DEPAKOTE ER) 500 MG 24 hr tablet  12/03/13   Historical Provider, MD  fluconazole (DIFLUCAN) 150 MG tablet Take 1 tablet (150 mg total) by mouth once. 09/07/13   Glori LuisEric G Sonnenberg, MD  glipiZIDE (GLUCOTROL) 5 MG tablet Take 1 tablet (5 mg total) by mouth daily before breakfast. 06/26/13   Charm RingsErin J Honig, MD  metFORMIN (GLUCOPHAGE XR) 750 MG 24 hr tablet Take 2 tablets (1,500 mg total) by mouth daily after supper. 05/11/13   Lora PaulaJosalyn C Funches, MD  traZODone (DESYREL) 50 MG tablet Take 1 tablet (50 mg total) by mouth at bedtime as needed for sleep. 08/16/12   Carney LivingMarshall L Chambliss, MD   BP 122/83 mmHg  Pulse 86  Temp(Src) 98.8 F (37.1 C) (Oral)  Resp 16  Ht 5\' 4"  (1.626 m)  Wt 191 lb 4 oz (86.75 kg)  BMI 32.81 kg/m2  SpO2 98%  LMP 02/25/2014 Physical Exam  Constitutional: She appears well-developed and well-nourished. No distress.  HENT:  Head: Normocephalic and atraumatic.  Mouth/Throat: Oropharynx is clear and moist. No oropharyngeal exudate.  Eyes: Conjunctivae are normal.  Neck: Neck supple. No thyromegaly present.  Cardiovascular: Normal rate, regular rhythm and intact distal pulses.   Pulmonary/Chest: Effort normal and breath sounds normal. No respiratory distress. She has no wheezes. She has no rales. She exhibits no tenderness.  Abdominal: Soft. There is no tenderness.  Musculoskeletal: She exhibits no tenderness.  Lymphadenopathy:    She has no cervical adenopathy.  Neurological: She is alert.  Skin: Skin is warm and dry. No rash noted. She is not diaphoretic.  Psychiatric:  She has a normal mood and affect.  Nursing note and vitals reviewed.   ED Course  Procedures (including critical care time) Labs Review Labs Reviewed  CBC - Abnormal; Notable for the following:    RBC 5.25 (*)    MCV 73.0 (*)    MCH 23.8 (*)    All other components within normal limits  BASIC METABOLIC PANEL - Abnormal; Notable for the following:    Sodium 134 (*)    Glucose, Bld 311 (*)    GFR calc non Af Amer 76 (*)    GFR calc Af Amer 88 (*)    All other components within normal limits  CBG MONITORING, ED - Abnormal; Notable for the following:    Glucose-Capillary 295 (*)    All other components within normal limits  BRAIN NATRIURETIC PEPTIDE  D-DIMER, QUANTITATIVE  TROPONIN I  Rosezena Sensor, ED    Imaging Review Dg Chest 2 View  03/26/2014   CLINICAL DATA:  Short of breath.  EXAM: CHEST  2 VIEW  COMPARISON:  07/10/2013  FINDINGS: The heart size and mediastinal contours are within normal limits. Both lungs are clear. The visualized skeletal structures are unremarkable.  IMPRESSION: No active cardiopulmonary disease.   Electronically Signed   By: Marlan Palau M.D.   On: 03/26/2014 21:41     EKG Interpretation   Date/Time:  Tuesday March 26 2014 20:27:52 EST Ventricular Rate:  88 PR Interval:  146 QRS Duration: 84 QT Interval:  348 QTC Calculation: 421 R Axis:   95 Text Interpretation:  Normal sinus rhythm with sinus arrhythmia Rightward  axis Borderline ECG No significant change was found Confirmed by Manus Gunning   MD, STEPHEN [phone]) on 03/26/2014 10:47:47 PM      MDM   Final diagnoses:  Exertional shortness of breath   34 yo with chest pain and shortness of breath only on exertion x 4 days.  CBC, BMP, Troponin, BNP, D-dimer and CXR checked without abnormality except for elevated glucose: 311. Pt reports non-adherence to diabetes meds due to gi upset. NS bolus given. Pt without any complaint during exam except is tearful about a social situation with  daughter being arrested.  Pt ambulated around department without recreation of symptoms or decrease in O2 sats. Discussed case with on-call Family Practice physician, instructed pt to call in am to set-up outpt stress test for tomorrow afternoon.  Discussed with pt and she is agreeable with plan. Pt is well-appearing, in no acute distress and vital signs reviewed and not concerning. She appears safe to be discharged.  Return precautions provided.    Filed Vitals:   03/26/14 2245 03/26/14 2324 03/27/14 0030 03/27/14 0113  BP: 125/80 126/81 132/77 106/67  Pulse: 77 97 83 94  Temp:  98.4 F (36.9 C)  98.4 F (36.9 C)  TempSrc:  Oral  Oral  Resp:  Height:      Weight:      SpO2: 99% 99%  100% 100%   Meds given in ED:  Medications  sodium chloride 0.9 % bolus 1,000 mL (0 mLs Intravenous Stopped 03/27/14 0115)    Discharge Medication List as of 03/27/2014  1:00 AM          Harle Battiest, NP 03/28/14 1610  Glynn Octave, MD 03/28/14 [phone]

## 2014-03-26 NOTE — ED Notes (Signed)
Pt ambulatory to room.

## 2014-03-26 NOTE — ED Notes (Signed)
C/o SOB with exertion since sat, mild chest pressure, A/O X4, ambulatory and in NAD

## 2014-03-27 ENCOUNTER — Telehealth: Payer: Self-pay | Admitting: Family Medicine

## 2014-03-27 DIAGNOSIS — IMO0002 Reserved for concepts with insufficient information to code with codable children: Secondary | ICD-10-CM

## 2014-03-27 DIAGNOSIS — E1165 Type 2 diabetes mellitus with hyperglycemia: Secondary | ICD-10-CM

## 2014-03-27 LAB — D-DIMER, QUANTITATIVE (NOT AT ARMC)

## 2014-03-27 MED ORDER — GLIPIZIDE 5 MG PO TABS: 5.0000 mg | ORAL_TABLET | Freq: Every day | ORAL | Status: DC

## 2014-03-27 MED ORDER — METFORMIN HCL ER 750 MG PO TB24: 1500.0000 mg | ORAL_TABLET | Freq: Every day | ORAL | Status: DC

## 2014-03-27 NOTE — Telephone Encounter (Signed)
Will forward to MD for review. Kaileigh Viswanathan, CMA 

## 2014-03-27 NOTE — Discharge Instructions (Signed)
Please follow the directions provided.  Be sure to call Dr. Deirdre Priesthambliss in the morning to arrange a stress test. They will be able to fit you in tomorrow. Drink plenty of fluids eat a balanced diet.  Don't hesitate to return for any new, worsening or concerning symptoms.    SEEK IMMEDIATE MEDICAL CARE IF:  Your shortness of breath gets worse.  You feel light-headed, faint, or develop a cough not controlled with medicines.  You start coughing up blood.  You have pain with breathing.  You have chest pain or pain in your arms, shoulders, or abdomen.  You have a fever.  You are unable to walk up stairs or exercise the way you normally do

## 2014-03-27 NOTE — Telephone Encounter (Signed)
[name] was seen in ED last night for chest pain and elevated glucose level 295.  Patient was informed to see provider and ask for stress test.  Have appt with Dr. Lum BabeEniola on Friday for ED f/u and scheduled with regular provider on 3/23.  Please send refill for glipizide and metformin to [facility] on [address].

## 2014-03-29 ENCOUNTER — Encounter: Payer: Self-pay | Admitting: Family Medicine

## 2014-03-29 ENCOUNTER — Ambulatory Visit (INDEPENDENT_AMBULATORY_CARE_PROVIDER_SITE_OTHER): Payer: Medicaid Other | Admitting: Family Medicine

## 2014-03-29 VITALS — BP 108/75 | HR 94 | Temp 98.4°F | Wt 190.0 lb

## 2014-03-29 DIAGNOSIS — R7309 Other abnormal glucose: Secondary | ICD-10-CM

## 2014-03-29 DIAGNOSIS — R079 Chest pain, unspecified: Secondary | ICD-10-CM | POA: Insufficient documentation

## 2014-03-29 DIAGNOSIS — R5383 Other fatigue: Secondary | ICD-10-CM | POA: Insufficient documentation

## 2014-03-29 DIAGNOSIS — IMO0002 Reserved for concepts with insufficient information to code with codable children: Secondary | ICD-10-CM

## 2014-03-29 DIAGNOSIS — F4323 Adjustment disorder with mixed anxiety and depressed mood: Secondary | ICD-10-CM | POA: Insufficient documentation

## 2014-03-29 DIAGNOSIS — R739 Hyperglycemia, unspecified: Secondary | ICD-10-CM

## 2014-03-29 DIAGNOSIS — E1165 Type 2 diabetes mellitus with hyperglycemia: Secondary | ICD-10-CM

## 2014-03-29 LAB — POCT GLYCOSYLATED HEMOGLOBIN (HGB A1C): HEMOGLOBIN A1C: 10

## 2014-03-29 LAB — GLUCOSE, CAPILLARY: Glucose-Capillary: 288 mg/dL — ABNORMAL HIGH (ref 70–99)

## 2014-03-29 MED ORDER — CLONAZEPAM 1 MG PO TABS: 1.0000 mg | ORAL_TABLET | Freq: Two times a day (BID) | ORAL | Status: DC | PRN

## 2014-03-29 MED ORDER — VENLAFAXINE HCL 37.5 MG PO TABS: 37.5000 mg | ORAL_TABLET | Freq: Two times a day (BID) | ORAL | Status: DC

## 2014-03-29 NOTE — Progress Notes (Signed)
Subjective:     Patient ID: Sara Brock, female   DOB: [birth year], 34 y.o.   MRN: [id number]  HPI  ED F/U:She was seen at the ED 2 days ago for SOB on exertion and chest pain on going for 6 days ago, no cough, better at rest.She described her chest pain as pressure like, centrally located,no radiation of pain, no diaphoresis, this is worse on exertion associated with SOB. Last symptom was this morning while taking a shower.No prior hx.  DM2:  Was recently seen at the ED with elevated glucose level, patient stated she had been off medication for few months due to her sad mood but restarted back 2 wks ago. She does have glucometer at home but she does not feel the drive to check her CBG. Fatigue: C/O tiredness for months, the whole process started years ago with her daughter getting into trouble and seem to have custody battle issue. Patient became teary and will not go into detail what is going on, she stated she is tired of everything, denies suicidal ideation but feels depressed. She had been on antidepressant in the past but self d/c them. She self-committed herself to inpatient Psych last Dec, she was started on some medication which she took for only one day. Patient indicated she need help and will like to get treatment for her depression.She stated she has been getting exteremly nervous which affects her SOB and chest pain as well.  Current Outpatient Prescriptions on File Prior to Visit  Medication Sig Dispense Refill  . glipiZIDE (GLUCOTROL) 5 MG tablet Take 1 tablet (5 mg total) by mouth daily before breakfast. 30 tablet 0  . metFORMIN (GLUCOPHAGE XR) 750 MG 24 hr tablet Take 2 tablets (1,500 mg total) by mouth daily after supper. 60 tablet 3  . atorvastatin (LIPITOR) 40 MG tablet Take 1 tablet (40 mg total) by mouth daily at 6 PM. (Patient not taking: Reported on 03/26/2014) 90 tablet 3  . divalproex (DEPAKOTE ER) 500 MG 24 hr tablet   0  . fluconazole (DIFLUCAN) 150 MG tablet Take 1  tablet (150 mg total) by mouth once. (Patient not taking: Reported on 03/26/2014) 1 tablet 0  . traZODone (DESYREL) 50 MG tablet Take 1 tablet (50 mg total) by mouth at bedtime as needed for sleep. (Patient not taking: Reported on 03/29/2014) 30 tablet 0   No current facility-administered medications on file prior to visit.   Past Medical History  Diagnosis Date  . Abscess   . Diabetes mellitus   . Seasonal allergies       Review of Systems  Constitutional: Negative.   Respiratory: Positive for shortness of breath. Negative for cough and wheezing.   Cardiovascular: Positive for chest pain. Negative for palpitations and leg swelling.  Gastrointestinal: Negative.   Psychiatric/Behavioral: Positive for decreased concentration. Negative for suicidal ideas, hallucinations, sleep disturbance and self-injury. The patient is nervous/anxious. The patient is not hyperactive.   All other systems reviewed and are negative.  Filed Vitals:   03/29/14 1125  BP: 108/75  Pulse: 94  Temp: 98.4 F (36.9 C)  TempSrc: Oral  Weight: 190 lb (86.183 kg)  SpO2: 99%       Objective:   Physical Exam  Constitutional: She appears well-developed. No distress.  Cardiovascular: Normal rate, normal heart sounds and intact distal pulses.   No murmur heard. Pulmonary/Chest: Effort normal and breath sounds normal. No respiratory distress. She has no wheezes.  Abdominal: Soft. Bowel sounds are normal. She exhibits  no distension and no mass. There is no tenderness.  Musculoskeletal: Normal range of motion. She exhibits no edema.  Psychiatric: Her speech is normal. Thought content normal. Her affect is blunt. She is slowed and withdrawn. Cognition and memory are normal. She exhibits a depressed mood. She expresses no suicidal ideation. She expresses no suicidal plans.  Extremely teary through this visit.  Nursing note and vitals reviewed.      Assessment:     Chest pain + SOB DM2 Fatigue: Likely due to  Depression     Plan:     Check problem list.

## 2014-03-29 NOTE — Assessment & Plan Note (Addendum)
Patient seems to have depression mixed with anxiety which is stress related due to family issues. GAD score of 21 today and PHQ9 score of 11. I recommended restarting antidepressant. She had been on Zoloft and Paxil in the past. I started her on Effexor today. I recommended psychotherapy and gave list of resources available. She agreed to schedule appointment as soon as possible. Klonopin prn anxiety for short duration prescribed. F/U in 2 wks for reassessment. She is instructed to call us or dial 911 if suicidal. She verbalized understanding. Currently not suicidal and safe to go home.

## 2014-03-29 NOTE — Assessment & Plan Note (Addendum)
Currently asymptomatic. S/P ED assessment where she had normal EKG,CXRAY and troponin. Recommendation was made to get stress test done. Symptom likely due to anxiety and depression however she also has cardiac risk factors (DM2 uncontrolled) I recommended she start her Lipitor which she self d/c. I recommended starting baby ASA. She is set up for stress test in 4 wks however Dr Jennette KettleNeal will also review her chart and see if she can get her in sooner for ST. Return precaution discussed.

## 2014-03-29 NOTE — Patient Instructions (Signed)
It was nice seeing you today, it seem your symptom is more related to anxiety and depression, I will like for your to start Effexor as well as Klonopin to see if there will be some improvement. Also call mental health specialist from list I gave to schedule appointment as soon as possible. We will work on getting you to see a cardiologist for stress test as well for your chest pain. See PCP in 2 wks   Depression Depression refers to feeling sad, low, down in the dumps, blue, gloomy, or empty. In general, there are two kinds of depression: 1. Normal sadness or normal grief. This kind of depression is one that we all feel from time to time after upsetting life experiences, such as the loss of a job or the ending of a relationship. This kind of depression is considered normal, is short lived, and resolves within a few days to 2 weeks. Depression experienced after the loss of a loved one (bereavement) often lasts longer than 2 weeks but normally gets better with time. 2. Clinical depression. This kind of depression lasts longer than normal sadness or normal grief or interferes with your ability to function at home, at work, and in school. It also interferes with your personal relationships. It affects almost every aspect of your life. Clinical depression is an illness. Symptoms of depression can also be caused by conditions other than those mentioned above, such as:  Physical illness. Some physical illnesses, including underactive thyroid gland (hypothyroidism), severe anemia, specific types of cancer, diabetes, uncontrolled seizures, heart and lung problems, strokes, and chronic pain are commonly associated with symptoms of depression.  Side effects of some prescription medicine. In some people, certain types of medicine can cause symptoms of depression.  Substance abuse. Abuse of alcohol and illicit drugs can cause symptoms of depression. SYMPTOMS Symptoms of normal sadness and normal grief include the  following:  Feeling sad or crying for short periods of time.  Not caring about anything (apathy).  Difficulty sleeping or sleeping too much.  No longer able to enjoy the things you used to enjoy.  Desire to be by oneself all the time (social isolation).  Lack of energy or motivation.  Difficulty concentrating or remembering.  Change in appetite or weight.  Restlessness or agitation. Symptoms of clinical depression include the same symptoms of normal sadness or normal grief and also the following symptoms:  Feeling sad or crying all the time.  Feelings of guilt or worthlessness.  Feelings of hopelessness or helplessness.  Thoughts of suicide or the desire to harm yourself (suicidal ideation).  Loss of touch with reality (psychotic symptoms). Seeing or hearing things that are not real (hallucinations) or having false beliefs about your life or the people around you (delusions and paranoia). DIAGNOSIS  The diagnosis of clinical depression is usually based on how bad the symptoms are and how long they have lasted. Your health care provider will also ask you questions about your medical history and substance use to find out if physical illness, use of prescription medicine, or substance abuse is causing your depression. Your health care provider may also order blood tests. TREATMENT  Often, normal sadness and normal grief do not require treatment. However, sometimes antidepressant medicine is given for bereavement to ease the depressive symptoms until they resolve. The treatment for clinical depression depends on how bad the symptoms are but often includes antidepressant medicine, counseling with a mental health professional, or both. Your health care provider will help to determine  what treatment is best for you. Depression caused by physical illness usually goes away with appropriate medical treatment of the illness. If prescription medicine is causing depression, talk with your  health care provider about stopping the medicine, decreasing the dose, or changing to another medicine. Depression caused by the abuse of alcohol or illicit drugs goes away when you stop using these substances. Some adults need professional help in order to stop drinking or using drugs. SEEK IMMEDIATE MEDICAL CARE IF:  You have thoughts about hurting yourself or others.  You lose touch with reality (have psychotic symptoms).  You are taking medicine for depression and have a serious side effect. FOR MORE INFORMATION  National Alliance on Mental Illness: www.nami.[employer]  National Institute of Mental Health: http://www.maynard.net/www.nimh.nih.gov Document Released: 01/16/2000 Document Revised: 06/04/2013 Document Reviewed: 04/19/2011 [facility] Patient Information 2015 [place] [place]. This information is not intended to replace advice given to you by your health care provider. Make sure you discuss any questions you have with your health care provider.

## 2014-03-29 NOTE — Assessment & Plan Note (Signed)
Likely due to depression. Her description sounds like lack of motivation to get up and do things. CBC while in the hospital was normal. Plan to treat for depression. Graded exercise recommended.

## 2014-03-29 NOTE — Assessment & Plan Note (Signed)
She had not been well controlled in the past. A1C today is 10 from 8. ?? Related to poor compliance vs worsening Dm despite medication. I recommended continuation of Metformin 750 mg BID as she cannot tolerate higher dose. Increase Glipizide to 10 mg daily from 5 mg. I recommended checking CBG at home if less than 70 persistently to return back to Glipizide 5 mg qd. F/U with PCP in 2 wks. She verbalized understanding.

## 2014-04-01 ENCOUNTER — Telehealth: Payer: Self-pay | Admitting: Family Medicine

## 2014-04-01 NOTE — Telephone Encounter (Signed)
Pt is still having trouble keeping her breath. She has treadmill test scheduled for 3/22.  Was told someone here was working on getting her in sooner Please advise

## 2014-04-02 NOTE — Telephone Encounter (Signed)
It seem Dr Jennette KettleNeal recommended she sees Cardiologist. Please check directly with Dr Forde DandyNeal Jazmin.

## 2014-04-02 NOTE — Telephone Encounter (Signed)
Good, please let her know to go to the ED if symptom persist.

## 2014-04-02 NOTE — Telephone Encounter (Signed)
Pt was already scheduled an appt with both an exercise tolerance test and cards appt while in clinic last week.  Will check with Dr. Jennette KettleNeal to see if this patient needs to be seen sooner for treadmill.  Jazmin Hartsell,CMA

## 2014-04-02 NOTE — Telephone Encounter (Signed)
LM for patient to call back.  Was unable to get her in sooner.  Will inform of this and message from MD when she [facility]

## 2014-04-02 NOTE — Telephone Encounter (Signed)
Will forward to Dr. Lum BabeEniola to see if she has discussed this with Dr. Jennette KettleNeal.  We cannot try and schedule her at the hospital until this has been taken care of.  [facility]azmin Brock,CMA

## 2014-04-23 ENCOUNTER — Ambulatory Visit (INDEPENDENT_AMBULATORY_CARE_PROVIDER_SITE_OTHER): Payer: Medicaid Other | Admitting: Cardiology

## 2014-04-23 DIAGNOSIS — R079 Chest pain, unspecified: Secondary | ICD-10-CM | POA: Diagnosis not present

## 2014-04-23 NOTE — Progress Notes (Signed)
Exercise Treadmill Test  Pre-Exercise Testing Evaluation Rhythm: sinus tachycardia  Rate: 125 bpm     Test  Exercise Tolerance Test Ordering MD: Lewayne BuntingGregg Taylor, MD  Interpreting MD: Corine Shelterluke Kilroy, PA-C  Unique Test No: 1  Treadmill:  1  Indication for ETT: chest pain - rule out ischemia  Contraindication to ETT: No   Stress Modality: exercise - treadmill  Cardiac Imaging Performed: non   Protocol: standard Bruce - maximal  Max BP:  175/75  Max MPHR (bpm):  185 85% MPR (bpm):  157  MPHR obtained (bpm):  176 % MPHR obtained:  95  Reached 85% MPHR (min:sec):  4:01 Total Exercise Time (min-sec):  8:00  Workload in METS:  10.1 Borg Scale: 17  Reason ETT Terminated:  fatigue    ST Segment Analysis At Rest: normal ST segments - no evidence of significant ST depression With Exercise: no evidence of significant ST depression  Other Information Arrhythmia:  No Angina during ETT:  absent (0) Quality of ETT:  diagnostic  ETT Interpretation:  normal - no evidence of ischemia by ST analysis  Comments:   Recommendations: To see Dr Delton SeeNelson This week

## 2014-04-24 ENCOUNTER — Encounter: Payer: Self-pay | Admitting: *Deleted

## 2014-04-24 ENCOUNTER — Ambulatory Visit: Payer: Medicaid Other | Admitting: Family Medicine

## 2014-04-25 ENCOUNTER — Encounter: Payer: Self-pay | Admitting: Cardiology

## 2014-04-25 ENCOUNTER — Ambulatory Visit (INDEPENDENT_AMBULATORY_CARE_PROVIDER_SITE_OTHER): Payer: Medicaid Other | Admitting: Cardiology

## 2014-04-25 VITALS — HR 82 | Ht 64.0 in | Wt 191.2 lb

## 2014-04-25 DIAGNOSIS — E669 Obesity, unspecified: Secondary | ICD-10-CM

## 2014-04-25 DIAGNOSIS — E119 Type 2 diabetes mellitus without complications: Secondary | ICD-10-CM | POA: Diagnosis not present

## 2014-04-25 DIAGNOSIS — E1169 Type 2 diabetes mellitus with other specified complication: Secondary | ICD-10-CM

## 2014-04-25 DIAGNOSIS — R079 Chest pain, unspecified: Secondary | ICD-10-CM | POA: Diagnosis not present

## 2014-04-25 NOTE — Progress Notes (Signed)
Patient ID: HERMENA SWINT, female   DOB: September 30, 1980, 34 y.o.   MRN: [id number]      Cardiology Office Note  Date:  04/25/2014   ID:  AUBREA MEIXNER, DOB 01-Nov-1980, MRN [id number]  PCP:  Carney Living, MD  Cardiologist:  Lars Masson, MD   No chief complaint on file.  History of Present Illness: AKILA BATTA is a 34 y.o. female who presents for revision of chest pain. The patient is a very pleasant 34 year old female with past medical history of diabetes type 2 diagnosed 2 years ago on oral anti-diabetics and hyperlipidemia. The patient has been going through extreme emotional burden with her 58 year old daughter being charged for drug session and being in jail. The patient developed chest pain while walking in a mall with her mom it felt pressure like retrosternal associated shortness of breath and hyperventilation. She denies any other episodes of chest pain. She doesn't exercise she has 4 kids and is busy but when she does strenuous work at home she doesn't develop chest pain. She denies any palpitations or syncope, no orthopnea paroxysmal nocturnal dyspnea or lower extremity edema. Family history of premature coronary artery disease.  Past Medical History  Diagnosis Date  . Abscess   . Diabetes mellitus   . Seasonal allergies     Past Surgical History  Procedure Laterality Date  . Tubal ligation      Current Outpatient Prescriptions  Medication Sig Dispense Refill  . atorvastatin (LIPITOR) 40 MG tablet Take 1 tablet (40 mg total) by mouth daily at 6 PM. (Patient not taking: Reported on 03/26/2014) 90 tablet 3  . clonazePAM (KLONOPIN) 1 MG tablet Take 1 tablet (1 mg total) by mouth 2 (two) times daily as needed for anxiety. 30 tablet 0  . divalproex (DEPAKOTE ER) 500 MG 24 hr tablet   0  . fluconazole (DIFLUCAN) 150 MG tablet Take 1 tablet (150 mg total) by mouth once. (Patient not taking: Reported on 03/26/2014) 1 tablet 0  . glipiZIDE (GLUCOTROL) 5  MG tablet Take 1 tablet (5 mg total) by mouth daily before breakfast. 30 tablet 0  . metFORMIN (GLUCOPHAGE XR) 750 MG 24 hr tablet Take 2 tablets (1,500 mg total) by mouth daily after supper. 60 tablet 3  . traZODone (DESYREL) 50 MG tablet Take 1 tablet (50 mg total) by mouth at bedtime as needed for sleep. (Patient not taking: Reported on 03/29/2014) 30 tablet 0  . venlafaxine (EFFEXOR) 37.5 MG tablet Take 1 tablet (37.5 mg total) by mouth 2 (two) times daily. 60 tablet 1   No current facility-administered medications for this visit.    Allergies:   Review of patient's allergies indicates no known allergies.    Social History:  The patient  reports that she has never smoked. She does not have any smokeless tobacco history on file. She reports that she uses illicit drugs (Marijuana). She reports that she does not drink alcohol.   Family History:  The patient's family history includes Diabetes in an other family member; Hyperlipidemia in her mother; Hypertension in her mother.   ROS:  Please see the history of present illness.  All other systems are reviewed and negative.   PHYSICAL EXAM: VS:  LMP 02/25/2014 , BMI There is no weight on file to calculate BMI. GEN: Well nourished, well developed, in no acute distress HEENT: normal Neck: no JVD, carotid bruits, or masses Cardiac: RRR; no murmurs, rubs, or gallops,no edema  Respiratory:  clear to auscultation bilaterally,  normal work of breathing GI: soft, nontender, nondistended, + BS MS: no deformity or atrophy Skin: warm and dry, no rash Neuro:  Strength and sensation are intact Psych: euthymic mood, full affect  EKG:  EKG is not ordered today. The ekg ordered today demonstrates NSR with sinus arrhythmia, rightward axis.  Recent Labs: 05/11/2013: ALT 16 03/26/2014: B Natriuretic Peptide 7.0; BUN 10; Creatinine 0.96; Hemoglobin 12.5; Platelets 174; Potassium 3.8; Sodium 134*   Lipid Panel    Component Value Date/Time   CHOL 150  05/11/2013 1437   TRIG 264* 05/11/2013 1437   HDL 22* 05/11/2013 1437   CHOLHDL 6.8 05/11/2013 1437   VLDL 53* 05/11/2013 1437   LDLCALC 75 05/11/2013 1437    Wt Readings from Last 3 Encounters:  03/29/14 190 lb (86.183 kg)  09/07/13 191 lb (86.637 kg)  07/10/13 194 lb (87.998 kg)    Other studies Reviewed: Additional studies/ records that were reviewed today include: ETT done on 04/23/2014 Review of the above records demonstrates:   ST Segment Analysis At Rest: normal ST segments - no evidence of significant ST depression With Exercise: no evidence of significant ST depression  Other Information Arrhythmia:  No Angina during ETT:  absent (0) Quality of ETT:  diagnostic  ETT Interpretation:  normal - no evidence of ischemia by ST analysis     ASSESSMENT AND PLAN:  34 year old female  1. Atypical chest pain - seems to be related to extreme anxiety she is going through, her baseline EKG is completely normal, and her exercise treadmill stress test was completely normal. She is advised to use some relaxation techniques to relieve some stress no other workup necessary at this point.  2. Blood pressure - controlled  3.  Diabetes mellitus type II - uncontrolled with hemoglobin A1c 10%, patient states that she has been very noncompliant with her meds as she has been busy doing other settings, she is advised to stick diet, exercise.   Current medicines are reviewed at length with the patient today.  The patient does not have concerns regarding medicines.  The following changes have been made:  no change  Labs/ tests ordered today include: none  No orders of the defined types were placed in this encounter.     Disposition:   FU with Meril Dray H  in 6 month.   Signed, Lars MassonNELSON, Elijio Staples H, MD  04/25/2014 8:42 AM    [facility] [address], [place], [place]  [address] Phone: [phone](336) [phone]; Fax: [phone]) [phone]

## 2014-04-25 NOTE — Patient Instructions (Signed)
Your physician recommends that you continue on your current medications as directed. Please refer to the Current Medication list given to you today.     Your physician wants you to follow-up in: 6 MONTHS WITH DR NELSON You will receive a reminder letter in the mail two months in advance. If you don't receive a letter, please call our office to schedule the follow-up appointment.  

## 2014-05-08 ENCOUNTER — Encounter: Payer: Self-pay | Admitting: Family Medicine

## 2014-05-15 ENCOUNTER — Ambulatory Visit (INDEPENDENT_AMBULATORY_CARE_PROVIDER_SITE_OTHER): Payer: Medicaid Other | Admitting: Family Medicine

## 2014-05-15 ENCOUNTER — Encounter: Payer: Self-pay | Admitting: Family Medicine

## 2014-05-15 VITALS — BP 104/76 | HR 91 | Temp 98.2°F | Ht 64.0 in | Wt 192.0 lb

## 2014-05-15 DIAGNOSIS — F329 Major depressive disorder, single episode, unspecified: Secondary | ICD-10-CM | POA: Diagnosis not present

## 2014-05-15 DIAGNOSIS — E1165 Type 2 diabetes mellitus with hyperglycemia: Secondary | ICD-10-CM | POA: Diagnosis not present

## 2014-05-15 DIAGNOSIS — F32A Depression, unspecified: Secondary | ICD-10-CM

## 2014-05-15 DIAGNOSIS — IMO0002 Reserved for concepts with insufficient information to code with codable children: Secondary | ICD-10-CM

## 2014-05-15 LAB — GLUCOSE, CAPILLARY: GLUCOSE-CAPILLARY: 223 mg/dL — AB (ref 70–99)

## 2014-05-15 NOTE — Patient Instructions (Signed)
Good to see you today!  Thanks for coming in.  Your bp is doing well  Continue to take your meds every day for your diabetes.  We will recheck your A!c in 2 months.  Call if any problems taking your meds  Call if you would like to be referred to a counselor or take an antidepressant  Bring all your meds next visit

## 2014-05-15 NOTE — Assessment & Plan Note (Signed)
Poorly controlled.  Hopefully will improve now taking her meds Discussed ways to remember

## 2014-05-15 NOTE — Progress Notes (Signed)
   Subjective:    Patient ID: Sara Brock, female    DOB: [birth year], 34 y.o.   MRN: [id number]  HPI DIABETES Disease Monitoring: Blood Sugar ranges-120s-250s Polyuria/phagia/dipsia- no      Visual problems- no Medications: Compliance- knows meds and doses Hypoglycemic symptoms- no  DEPRESSION Disease Monitoring Current symptoms include depressed mood and fatigue            Symptoms have been unchanged     Is Exercising no  Evidence of suicidal ideation: no  Medication Monitoring Compliance: not currently on medications for this problem  Tried effexor for a few days but did not like.  Feels she just needs to get a job and will do better  ROS - See HPI  Chief Complaint noted Review of Symptoms - see HPI PMH - Smoking status noted.   Vital Signs reviewed     Review of Systems     Objective:   Physical Exam Alert nad Psych:  Cognition and judgment appear intact. Alert, communicative  and cooperative with normal attention span and concentration. No apparent delusions, illusions, hallucinations        Assessment & Plan:

## 2014-05-15 NOTE — Assessment & Plan Note (Addendum)
Not well controlled. PHQ9 = 17  She does not want to take meds nor see a counselor at this time.  Discussed importance of making sure does not worsen and that she can call me to start either or both of these treatments.

## 2014-09-18 ENCOUNTER — Encounter: Payer: Self-pay | Admitting: Family Medicine

## 2014-09-18 ENCOUNTER — Ambulatory Visit (INDEPENDENT_AMBULATORY_CARE_PROVIDER_SITE_OTHER): Payer: Medicaid Other | Admitting: Family Medicine

## 2014-09-18 VITALS — BP 117/72 | HR 88 | Temp 98.1°F | Ht 64.0 in | Wt 184.5 lb

## 2014-09-18 DIAGNOSIS — E1165 Type 2 diabetes mellitus with hyperglycemia: Secondary | ICD-10-CM | POA: Diagnosis not present

## 2014-09-18 DIAGNOSIS — IMO0002 Reserved for concepts with insufficient information to code with codable children: Secondary | ICD-10-CM

## 2014-09-18 DIAGNOSIS — F4323 Adjustment disorder with mixed anxiety and depressed mood: Secondary | ICD-10-CM

## 2014-09-18 LAB — POCT GLYCOSYLATED HEMOGLOBIN (HGB A1C): Hemoglobin A1C: 11.5

## 2014-09-18 NOTE — Assessment & Plan Note (Signed)
Poorly controlled.  Most likely due to not taking medications regularly.  Spent 20 minutes discussing ways to improve compliance and drink choices and how to approach behavior change

## 2014-09-18 NOTE — Patient Instructions (Signed)
Good to see you today!  Thanks for coming in.  Take Glipizide every AM  Take metformin either twice daily or 2 every evening - every day  No calorie drinks - diet drinks or water but no sweet drinks - juice or gatorade  Come back in 3 months if you are doing all of the above without any problems.  If not then come back in 1 month

## 2014-09-18 NOTE — Progress Notes (Signed)
   Subjective:    Patient ID: CANDUS BRAUD, female    DOB: 17-Sep-1980, 34 y.o.   MRN: [id number]  HPI  DIABETES Disease Monitoring: Blood Sugar ranges-not checking Polyuria/phagia/dipsia- no      Visual problems- no Medications: Compliance- taking metformin every evening but misses often in am.  Not taking glipizide Hypoglycemic symptoms- no Drinking gatorade and juice and water  MOODS he feels things are stable.  Back to a routine with her kids going back to school.  Does not think needs counseling or medication.   Sleep is ok.  Appetite is good    Chief Complaint noted Review of Symptoms - see HPI PMH - Smoking status noted.   Vital Signs reviewed     Review of Systems     Objective:   Physical Exam  Alert nad Diabetic Foot Check -  Appearance - no lesions, ulcers or calluses Skin - no unusual pallor or redness Right - Great toe, medial, central, lateral ball and posterior foot intact Left - Great toe, medial, central, lateral ball and posterior foot intact       Assessment & Plan:

## 2014-09-18 NOTE — Assessment & Plan Note (Signed)
She feels things are stable.  Back to a routine with her kids going back to school.  Does not think needs counseling or medication.

## 2014-11-12 ENCOUNTER — Other Ambulatory Visit: Payer: Self-pay | Admitting: Family Medicine

## 2014-11-13 ENCOUNTER — Encounter: Payer: Self-pay | Admitting: Cardiology

## 2014-11-13 ENCOUNTER — Ambulatory Visit (INDEPENDENT_AMBULATORY_CARE_PROVIDER_SITE_OTHER): Payer: Medicaid Other | Admitting: Cardiology

## 2014-11-13 VITALS — BP 112/64 | HR 74 | Ht 64.0 in | Wt 188.0 lb

## 2014-11-13 DIAGNOSIS — R079 Chest pain, unspecified: Secondary | ICD-10-CM | POA: Insufficient documentation

## 2014-11-13 DIAGNOSIS — I10 Essential (primary) hypertension: Secondary | ICD-10-CM

## 2014-11-13 DIAGNOSIS — E785 Hyperlipidemia, unspecified: Secondary | ICD-10-CM

## 2014-11-13 DIAGNOSIS — R072 Precordial pain: Secondary | ICD-10-CM | POA: Diagnosis not present

## 2014-11-13 NOTE — Progress Notes (Signed)
Patient ID: Sara Brock, female   DOB: 01/25/1981, 34 y.o.   MRN: [id number]      Cardiology Office Note  Date:  11/13/2014   ID:  Sara Brock, DOB [birth year], MRN [id number]  PCP:  Carney Living, MD  Cardiologist:  Lars Masson, MD   No chief complaint on file.  History of Present Illness: Sara Brock is a 34 y.o. female who presents for revision of chest pain. The patient is a very pleasant 34 year old female with past medical history of diabetes type 2 diagnosed 2 years ago on oral anti-diabetics and hyperlipidemia. The patient has been going through extreme emotional burden with her 90 year old daughter being charged for drug session and being in jail. The patient developed chest pain while walking in a mall with her mom it felt pressure like retrosternal associated shortness of breath and hyperventilation. She denies any other episodes of chest pain. She doesn't exercise she has 4 kids and is busy but when she does strenuous work at home she doesn't develop chest pain. She denies any palpitations or syncope, no orthopnea paroxysmal nocturnal dyspnea or lower extremity edema. Family history of premature coronary artery disease.  11/13/2014 - 6 months follow up, resolved chest pain, less stress at home. The patient feels well, denies DOE, SOB, claudications, palpitations or syncope. Complaint to her meds, no side effects.  Past Medical History  Diagnosis Date  . Abscess   . Diabetes mellitus   . Seasonal allergies     Past Surgical History  Procedure Laterality Date  . Tubal ligation      Current Outpatient Prescriptions  Medication Sig Dispense Refill  . atorvastatin (LIPITOR) 40 MG tablet Take 1 tablet (40 mg total) by mouth daily at 6 PM. 90 tablet 3  . glipiZIDE (GLUCOTROL) 5 MG tablet Take 1 tablet (5 mg total) by mouth daily before breakfast. 30 tablet 0  . metFORMIN (GLUCOPHAGE XR) 750 MG 24 hr tablet Take 2 tablets (1,500 mg total) by  mouth daily after supper. 60 tablet 3   No current facility-administered medications for this visit.    Allergies:   Review of patient's allergies indicates no known allergies.    Social History:  The patient  reports that she has never smoked. She does not have any smokeless tobacco history on file. She reports that she uses illicit drugs (Marijuana). She reports that she does not drink alcohol.   Family History:  The patient's family history includes Diabetes in her brother, father, and another family member; Hyperlipidemia in her mother; Hypertension in her mother.   ROS:  Please see the history of present illness.  All other systems are reviewed and negative.   PHYSICAL EXAM: VS:  BP 112/64 mmHg  Pulse 74  Ht  (1.626 m)  Wt 188 lb (85.276 kg)  BMI 32.25 kg/m2 , BMI Body mass index is 32.25 kg/(m^2). GEN: Well nourished, well developed, in no acute distress HEENT: normal Neck: no JVD, carotid bruits, or masses Cardiac: RRR; no murmurs, rubs, or gallops,no edema  Respiratory:  clear to auscultation bilaterally, normal work of breathing GI: soft, nontender, nondistended, + BS MS: no deformity or atrophy Skin: warm and dry, no rash Neuro:  Strength and sensation are intact Psych: euthymic mood, full affect  EKG:  EKG is not ordered today. The ekg ordered today demonstrates NSR with sinus arrhythmia, rightward axis.  Recent Labs: 03/26/2014: B Natriuretic Peptide 7.0; BUN 10; Creatinine, Ser 0.96; Hemoglobin 12.5; Platelets 174; Potassium  3.8; Sodium 134*   Lipid Panel    Component Value Date/Time   CHOL 150 05/11/2013 1437   TRIG 264* 05/11/2013 1437   HDL 22* 05/11/2013 1437   CHOLHDL 6.8 05/11/2013 1437   VLDL 53* 05/11/2013 1437   LDLCALC 75 05/11/2013 1437    Wt Readings from Last 3 Encounters:  11/13/14 188 lb (85.276 kg)  09/18/14 184 lb 8 oz (83.689 kg)  05/15/14 192 lb (87.091 kg)    Other studies Reviewed: Additional studies/ records that were  reviewed today include: ETT done on 04/23/2014 Review of the above records demonstrates:   ST Segment Analysis At Rest: normal ST segments - no evidence of significant ST depression With Exercise: no evidence of significant ST depression  Other Information Arrhythmia:  No Angina during ETT:  absent (0) Quality of ETT:  diagnostic  ETT Interpretation:  normal - no evidence of ischemia by ST analysis     ASSESSMENT AND PLAN:  34 year old female  1. Atypical chest pain - seems to be related to extreme anxiety she is going through, her baseline EKG is completely normal, and her exercise treadmill stress test was completely normal. She is advised to use some relaxation techniques to relieve some stress no other workup necessary at this point.  2. Blood pressure - controlled  3.  Diabetes mellitus type II - uncontrolled with hemoglobin A1c 10%, patient states that she has been very noncompliant with her meds as she has been busy doing other settings, she is advised to stick diet, exercise.  4. Hyperlipidemia - on high dose of atorvastatin, followed by PCP  Follow up in 1 year  Signed, Lars MassonNELSON, Tiara Bartoli H, MD  11/13/2014 11:17 AM    [facility] [address], [place], [place]  [address] Phone: [phone](336) [phone]; Fax: [phone]) [phone]

## 2014-11-13 NOTE — Patient Instructions (Signed)
Medication Instructions:   Your physician recommends that you continue on your current medications as directed. Please refer to the Current Medication list given to you today.       Follow-Up:   Your physician wants you to follow-up in: ONE YEAR WITH DR NELSON You will receive a reminder letter in the mail two months in advance. If you don't receive a letter, please call our office to schedule the follow-up appointment.      

## 2015-01-04 ENCOUNTER — Other Ambulatory Visit: Payer: Self-pay | Admitting: Family Medicine

## 2015-04-11 ENCOUNTER — Ambulatory Visit (INDEPENDENT_AMBULATORY_CARE_PROVIDER_SITE_OTHER): Payer: Medicaid Other | Admitting: Family Medicine

## 2015-04-11 ENCOUNTER — Encounter: Payer: Self-pay | Admitting: Family Medicine

## 2015-04-11 VITALS — BP 110/83 | HR 99 | Temp 98.3°F | Wt 187.0 lb

## 2015-04-11 DIAGNOSIS — N760 Acute vaginitis: Secondary | ICD-10-CM | POA: Diagnosis not present

## 2015-04-11 DIAGNOSIS — E1165 Type 2 diabetes mellitus with hyperglycemia: Secondary | ICD-10-CM

## 2015-04-11 DIAGNOSIS — F1210 Cannabis abuse, uncomplicated: Secondary | ICD-10-CM | POA: Insufficient documentation

## 2015-04-11 DIAGNOSIS — F331 Major depressive disorder, recurrent, moderate: Secondary | ICD-10-CM

## 2015-04-11 DIAGNOSIS — E118 Type 2 diabetes mellitus with unspecified complications: Secondary | ICD-10-CM

## 2015-04-11 DIAGNOSIS — F339 Major depressive disorder, recurrent, unspecified: Secondary | ICD-10-CM | POA: Insufficient documentation

## 2015-04-11 LAB — COMPLETE METABOLIC PANEL WITH GFR
ALT: 13 U/L (ref 6–29)
AST: 11 U/L (ref 10–30)
Albumin: 4.1 g/dL (ref 3.6–5.1)
Alkaline Phosphatase: 61 U/L (ref 33–115)
BILIRUBIN TOTAL: 0.4 mg/dL (ref 0.2–1.2)
BUN: 8 mg/dL (ref 7–25)
CHLORIDE: 98 mmol/L (ref 98–110)
CO2: 26 mmol/L (ref 20–31)
CREATININE: 0.71 mg/dL (ref 0.50–1.10)
Calcium: 9.3 mg/dL (ref 8.6–10.2)
GFR, Est African American: >89 mL/min (ref >=60)
GFR, Est Non African American: >89 mL/min (ref >=60)
GLUCOSE: 303 mg/dL — AB (ref 65–99)
Potassium: 4.1 mmol/L (ref 3.5–5.3)
Sodium: 136 mmol/L (ref 135–146)
TOTAL PROTEIN: 6.9 g/dL (ref 6.1–8.1)

## 2015-04-11 LAB — GLUCOSE, CAPILLARY
GLUCOSE-CAPILLARY: 416 mg/dL — AB (ref 65–99)
GLUCOSE-CAPILLARY: 328 mg/dL — AB (ref 65–99)

## 2015-04-11 LAB — POCT GLYCOSYLATED HEMOGLOBIN (HGB A1C): Hemoglobin A1C: 9.3

## 2015-04-11 MED ORDER — SITAGLIPTIN PHOSPHATE 100 MG PO TABS: 100.0000 mg | ORAL_TABLET | Freq: Every day | ORAL | Status: DC

## 2015-04-11 MED ORDER — INSULIN ASPART 100 UNIT/ML ~~LOC~~ SOLN: 8.0000 [IU] | Freq: Once | SUBCUTANEOUS | Status: DC

## 2015-04-11 MED ORDER — FLUCONAZOLE 150 MG PO TABS: 150.0000 mg | ORAL_TABLET | Freq: Once | ORAL | Status: DC

## 2015-04-11 MED ORDER — VENLAFAXINE HCL ER 37.5 MG PO CP24: 37.5000 mg | ORAL_CAPSULE | Freq: Every day | ORAL | Status: DC

## 2015-04-11 NOTE — Progress Notes (Signed)
Dr. Lum BabeEniola requested a Behavioral Health Consult.   Presenting Issue: [name] presents with symptoms of major depression.  Report of symptoms: Depressed mood, lack of interest or pleasure in activities, hypersomnia, fatigue/decreased energy, decreased appetite, passive thoughts of death (denied active SI). Reports these symptoms are present most of the day nearly every day.  Duration of CURRENT symptoms: Patient reports that she has experienced these symptoms for several years (patient unable to be more specific).   Age of onset of first mood disturbance: Did not assess.  Impact on function: Patient reports that she is able to perform basic self-care activities such as cooking and cleaning. She also takes care of her children and reports helping take care of elderly people in her neighborhood. However, patient also reports that she sleeps 14 hours/day.  Psychiatric History - Diagnoses: Adjustment disorder (per chart). Patient's self-reported symptoms today are more consistent with Major Depressive Disorder. - Hospitalizations:Did not assess. - Pharmacotherapy: Patient reports that she has tried antidepressants in the past, but has never taken them as prescribed. Currently not taking anything. - Outpatient therapy: None; patient briefly tried psychotherapy in 2007.  Family history of psychiatric issues: Patient reported that her daughter has "mental health issues" and has been in legal trouble. Did not assess further.  Current and history of substance use: Patient reports daily marijuana use for the past two years; she reports that she does not like using it, but that it is the only thing that will help her eat.  Medical conditions that might explain or contribute to symptoms: Patient currently has diabetes, which may contribute to symptoms of depression.  Assessment / Plan / Recommendations:Patient reported numerous life stressors, including losing her job in 2007 and difficulty  finding new employment, the death of her son's father several years ago, managing her 37 year-old daughter's mental health issues, and managing her diabetes. Patient is actively seeking new employment, but received criminal charges related to an incident with her daughter; as a result, patient has not been able to successfully obtain employment. Patient reported symptoms of a major depressive episode lasting several years. Currently, patient reports that she sleeps 14 hours/day, and that the only way she can eat is by smoking marijuana. Of note, patient denied SI, but alluded to wishing that she was dead several times (e.g., reported that she frequently thinks "just take me now"). Patient has tried antidepressant medication, but strongly dislikes medication, and has never taken antidepressants for more than a few days. She reports that she does not have the motivation to take medication that isn't immediately effective, and has no interest. [facility] about [facility] medication and used motivational interviewing strategies to discuss medication use. Patient also reported that she would be willing to see a therapist, but does not believe that it will be helpful. She stated that she "just needs to work", and believes that going back to work will dramatically improve her symptoms. [facility] provided psychoeducation about behavioral activation and its benefits for helping with depression and diabetes; however, patient was not interested in setting specific behavioral activation goals. [facility] will find recommendation for a therapist that accepts Medicaid and call patient with referral.

## 2015-04-11 NOTE — Assessment & Plan Note (Signed)
Brief counseling given. She uses it for appetite stimulation. I recommended MVI instead. She will work on it. F/U with PCP for further counseling.

## 2015-04-11 NOTE — Patient Instructions (Signed)
It was nice seeing you today. I am sorry about your depression and uncontrolled diabetes. I do agree for us to stop your Metformin and Glipizide since your body is not tolerating it well. Let us start you on Januvia instead at a higher dose. Please continue to check you glucose at home and if it is still running high see your PCP in about 2 wks or better still just schedule f/u with your PCP in 2 wks now. I have started you on Effexor for your depression. It can take as long as 4 -8wks for you to see the effect. Call if you have any question.

## 2015-04-11 NOTE — Assessment & Plan Note (Signed)
GU exam deferred as this was not the original reason for visit. Her symptoms however is suggestive of candida vaginitis in poorly controlled DM. I prescribed 1 dose of diflucan. She is instructed to follow up in 1-2 days if no improvement with treatment so she can get adequate GU exam. She agreed with plan.

## 2015-04-11 NOTE — Assessment & Plan Note (Signed)
Patient appears moderately depressed with PHQ9 of 12. I counseled her on restarting her meds.  [facility] who took time to provide counseling. After counseling she agreed to go back on her previous medications. In the past she had been on Prozac and Effexor. I restarted her back on Effexor. I gave her list of psychiatrist and Psychologist in town for her to contact to make appointment for her depression. She agreed with plan. F/U with PCP in 2 wks.

## 2015-04-11 NOTE — Progress Notes (Addendum)
Subjective:     Patient ID: Sara Brock, female   DOB: [birth year], 35 y.o.   MRN: [id number]  Diabetes She presents for her follow-up diabetic visit. She has type 2 diabetes mellitus. Onset time: for many years. Disease course: Worsening. Glucose running high. Pertinent negatives for hypoglycemia include no confusion, dizziness, hunger, nervousness/anxiousness, seizures, speech difficulty or tremors. Associated symptoms include blurred vision, fatigue and foot paresthesias. Pertinent negatives for diabetes include no chest pain, no weakness and no weight loss. (Seems her vision is worsening. She was seen by ophthalmologist 2 yrs ago and was prescribed glasses. She sees Dr Alison StallingMarley at [facility]) Pertinent negatives for hypoglycemia complications include no blackouts and no nocturnal hypoglycemia. Symptoms are worsening. (Uncertain) Current diabetic treatments: Metformin 750 mg XR BID occasionally as well as Glipizide. She has side effects to medication so she does not take them as prescribed. She is compliant with treatment none of the time ( She has diarrhea with metformin and she stated her appetite is poor so she does not eat regularly , hence whenever she uses Glipizide she gets shakes so she stopped both meds. Of note, she mentioned marijuana use  to stimulate her appetite). She is following a generally healthy diet. When asked about meal planning, she reported none. She participates in exercise intermittently. Her home blood glucose trend is fluctuating dramatically. Her breakfast blood glucose range is generally >200 mg/dl. An ACE inhibitor/angiotensin II receptor blocker is not being taken. Eye exam is not current.  Depression      The patient presents with depression.  This is a recurrent problem.  The current episode started more than 1 year ago.   The onset quality is gradual.   The problem occurs constantly.  Duration: many years.    The problem has been gradually worsening since onset.   Associated symptoms include decreased concentration, fatigue, hopelessness, insomnia, decreased interest, appetite change and sad.  Associated symptoms include no restlessness and no suicidal ideas.     The symptoms are aggravated by family issues.  Treatments tried: She was on Prozac and Effexor for few days but she stopped since there was no immediate relieve of her symptoms.  Compliance with treatment is poor.  Past medical history includes depression.     Pertinent negatives include no suicide attempts. Vaginitis: C/O vaginal discharge and burning. She stated this is typical of yeast infection whenever her glucose is poorly controlled.   Review of Systems  Constitutional: Positive for appetite change and fatigue. Negative for weight loss.  Eyes: Positive for blurred vision.  Respiratory: Negative.   Cardiovascular: Negative.  Negative for chest pain.  Gastrointestinal: Negative.   Genitourinary: Negative.   Neurological: Negative.  Negative for dizziness, tremors, seizures, speech difficulty and weakness.  Psychiatric/Behavioral: Positive for depression and decreased concentration. Negative for suicidal ideas and confusion. The patient has insomnia. The patient is not nervous/anxious.   All other systems reviewed and are negative.      Objective:   Physical Exam  Constitutional: She is oriented to person, place, and time. She appears well-developed. No distress.  Cardiovascular: Normal rate, regular rhythm and normal heart sounds.   No murmur heard. Pulmonary/Chest: Effort normal and breath sounds normal. No respiratory distress. She has no wheezes. She has no rales.  Abdominal: Soft. Bowel sounds are normal. She exhibits no distension and no mass. There is no tenderness.  Genitourinary:  Deferred.   Musculoskeletal: She exhibits no edema.  Neurological: She is alert and oriented to  person, place, and time.  Psychiatric: Her speech is normal and behavior is normal. Judgment and  thought content normal. Thought content is not delusional. Cognition and memory are normal. She exhibits a depressed mood. She expresses no homicidal and no suicidal ideation. She expresses no suicidal plans and no homicidal plans.  Crying most of the time  Nursing note and vitals reviewed.      Assessment:     DM2 Major Depressive Disorder  Marijuana use/ Substance abuse Vaginitis    Plan:     Check problem list.

## 2015-04-11 NOTE — Assessment & Plan Note (Addendum)
Due to medication non-adherence. A1C improved to 9 from 11 in Aug of 2016. CBG today was 400. Uncertain if she had better control between August and now and then her A1C is gradually creeping up due to non-compliance with meds. She is unable to tolerate Glipizide and Metformin. I discussed possibility of other oral agents vs Insulin. She will return to discuss insulin with her PCP if her new oral agent does not help. 8 units of Novolog given. Repeat CGB was 312. I started her on Januvia 100 mg. Instructed to continue home CBG monitoring. Bmet checked today with urine microalbumin. F/U with PCP in 2 wks.

## 2015-04-12 LAB — MICROALBUMIN / CREATININE URINE RATIO: CREATININE, URINE: 91 mg/dL (ref 20–320)

## 2015-04-17 ENCOUNTER — Telehealth: Payer: Self-pay | Admitting: Family Medicine

## 2015-04-17 NOTE — Telephone Encounter (Signed)
Please have patient come see PCP as early as possible.

## 2015-04-17 NOTE — Telephone Encounter (Signed)
Pt called because she has recently been prescribed Venlafaxine. She said that when she took this in the AM and it made her so drowsy and she had no energy. She started taking this at night and it did help her sleep, but the next day she is still tired and again has no energy and only wants to sleep. She would like to change the dosage or try something else. The medication is in capsules now so she would not be able to break in half. jw

## 2015-04-17 NOTE — Telephone Encounter (Signed)
Will forward to Dr. Lum BabeEniola who saw patient for this visit.  She may need to come in and be reassessed by PCP soon also. Addy Mcmannis,CMA

## 2015-04-18 NOTE — Telephone Encounter (Signed)
Ok to dble book her on next Wed AM to see me

## 2015-04-18 NOTE — Telephone Encounter (Signed)
LM for patient ok per DPR signed in the chart.  PCP's next appt is not until 04-30-15 and will schedule her for this day when she calls back.  Will forward back to MD to see if patient needs to try something different with her medication until we are able to get her in to be seen.  Raylene Carmickle,CMA

## 2015-04-21 NOTE — Telephone Encounter (Signed)
LMOVM for pt to return call in the am. Fleeger, Sara RochesterJessica Brock, CMA

## 2015-04-23 LAB — HM DIABETES EYE EXAM

## 2015-04-24 NOTE — Telephone Encounter (Signed)
Clld pt - LMOVMTC re scheduling f/u appt w/ Dr. Deirdre Priesthambliss.

## 2015-05-09 ENCOUNTER — Telehealth: Payer: Self-pay | Admitting: Family Medicine

## 2015-05-09 NOTE — Telephone Encounter (Signed)
Left message on VM To make an appointment And to call and leave time and number where I can reach her

## 2015-05-30 ENCOUNTER — Other Ambulatory Visit: Payer: Self-pay | Admitting: Family Medicine

## 2015-05-30 NOTE — Telephone Encounter (Signed)
Patient is out of town for a funeral. Needs meds ASAP. States she will make appt to see Dr. Deirdre Priesthambliss once she is back in town.

## 2015-06-12 ENCOUNTER — Other Ambulatory Visit: Payer: Self-pay | Admitting: Family Medicine

## 2015-06-12 NOTE — Telephone Encounter (Signed)
Refill request for Januvia and glipizide.

## 2015-06-13 MED ORDER — GLIPIZIDE 5 MG PO TABS: ORAL_TABLET | ORAL | Status: DC

## 2015-06-13 MED ORDER — SITAGLIPTIN PHOSPHATE 100 MG PO TABS: ORAL_TABLET | ORAL | Status: DC

## 2015-06-18 ENCOUNTER — Encounter: Payer: Self-pay | Admitting: Family Medicine

## 2015-06-18 ENCOUNTER — Ambulatory Visit (INDEPENDENT_AMBULATORY_CARE_PROVIDER_SITE_OTHER): Payer: Medicaid Other | Admitting: Family Medicine

## 2015-06-18 VITALS — BP 113/72 | HR 89 | Temp 98.4°F | Ht 64.0 in | Wt 184.0 lb

## 2015-06-18 DIAGNOSIS — E1165 Type 2 diabetes mellitus with hyperglycemia: Secondary | ICD-10-CM

## 2015-06-18 DIAGNOSIS — Z114 Encounter for screening for human immunodeficiency virus [HIV]: Secondary | ICD-10-CM | POA: Diagnosis not present

## 2015-06-18 DIAGNOSIS — F331 Major depressive disorder, recurrent, moderate: Secondary | ICD-10-CM | POA: Diagnosis not present

## 2015-06-18 DIAGNOSIS — E118 Type 2 diabetes mellitus with unspecified complications: Secondary | ICD-10-CM | POA: Diagnosis not present

## 2015-06-18 LAB — LDL CHOLESTEROL, DIRECT: LDL DIRECT: 127 mg/dL (ref <130)

## 2015-06-18 LAB — GLUCOSE, CAPILLARY: GLUCOSE-CAPILLARY: 276 mg/dL — AB (ref 65–99)

## 2015-06-18 NOTE — Assessment & Plan Note (Signed)
Seems improved by her report.  Monitor closely

## 2015-06-18 NOTE — Progress Notes (Signed)
   Subjective:    Patient ID: Sara Brock, female    DOB: July 17, 1980, 35 y.o.   MRN: [id number]  HPI  DIABETES Disease Monitoring: Blood Sugar ranges(Severity) -not checking  Associated Symptoms- Polyuria/phagia/dipsia- no      Visual problems- no Medications: Compliance(Modifying factor) - taking januvia twice a day (went up on her own) and glipizde twice a day but has been out for one week Hypoglycemic symptoms- no Timing - continuous  Depression Feels much improved since is back to work at [employer].  More energy better sleep.  Still poor appetite for which she smokes pot each night.  Is exercising by walking with her daughter almost daily.  No SI Is frequently tearful "but I always am"   Stopped the Effexor made her too tired  Chief Complaint noted Review of Symptoms - see HPI PMH - Smoking status noted.   Vital Signs reviewed    Review of Systems     Objective:   Physical Exam Alert nad Psych:  Cognition and judgment appear intact. Alert, communicative  and cooperative with normal attention span and concentration. No apparent delusions, illusions, hallucinations         Assessment & Plan:

## 2015-06-18 NOTE — Patient Instructions (Signed)
Good to see you today!  Thanks for coming in.  Diabetes Your diabetes is not very controlled Your goal is to have an A1c < 8 Medicine Changes: Take the Januvia only ONCE a day.  Take the glipizide twice a day every day Homework: Come back in 1 month for a A1c check  Call us if: you feel worse   Come back to see us in: 4 weeks  For Depression   Seems to be getting better  Exercise at least 30 minutes every dy  Call if feeling worse  I will call you if your lab tests are not normal.  Otherwise we will discuss them at your next visit.

## 2015-06-18 NOTE — Assessment & Plan Note (Signed)
Not at goal and her blood sugar was high today.  Maybe due to being off glipizide.  Review how she takes her medications.  See after visit summary.

## 2015-06-19 LAB — HIV ANTIBODY (ROUTINE TESTING W REFLEX): HIV 1&2 Ab, 4th Generation: NONREACTIVE

## 2015-06-26 IMAGING — CR DG CHEST 2V
2 series · 2 of 2 positions shown · non-contrast
Comparison: 04/05/2012.

CLINICAL DATA: Productive cough for 3 days.

EXAM:
CHEST  2 VIEW

[w chest pa]
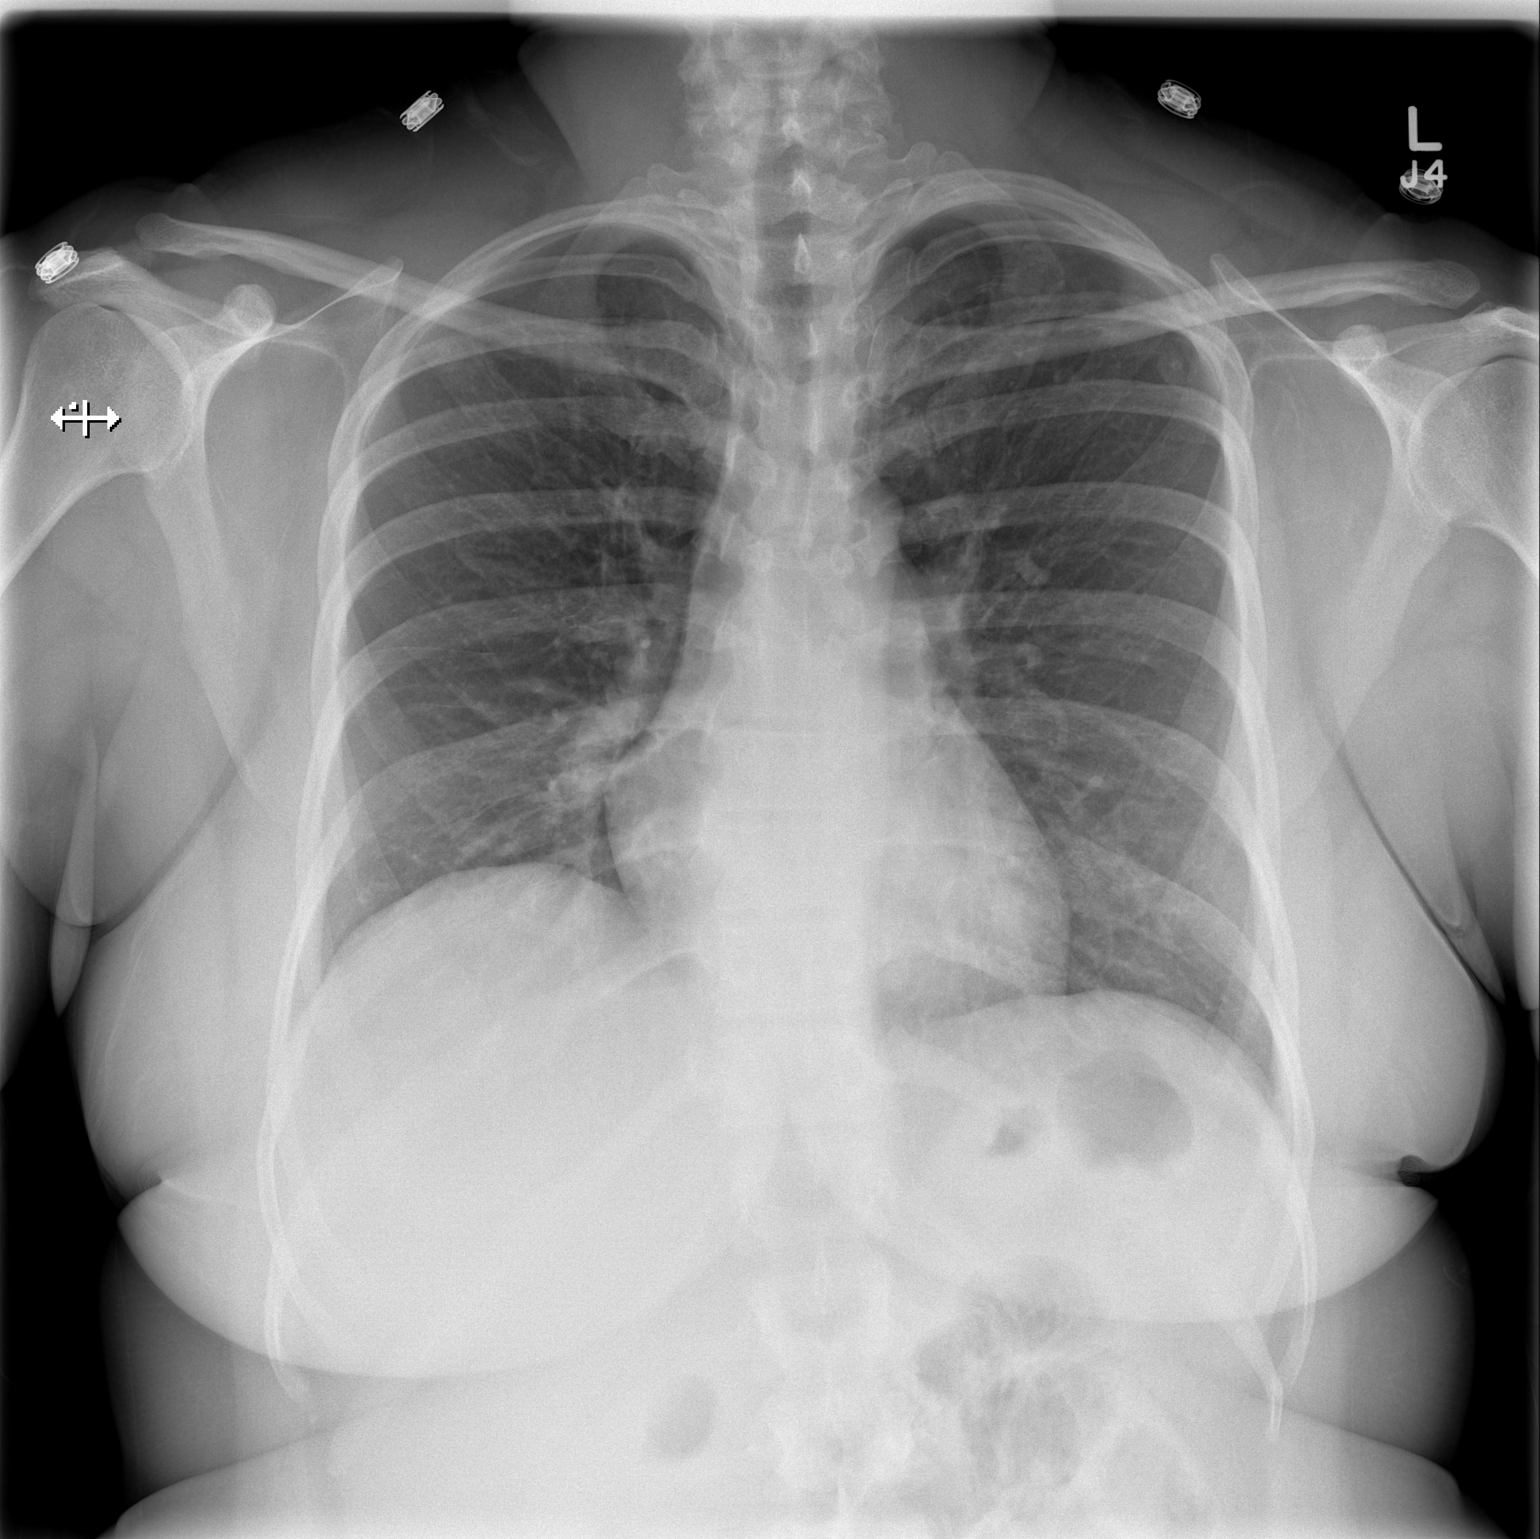

[w chest lat]
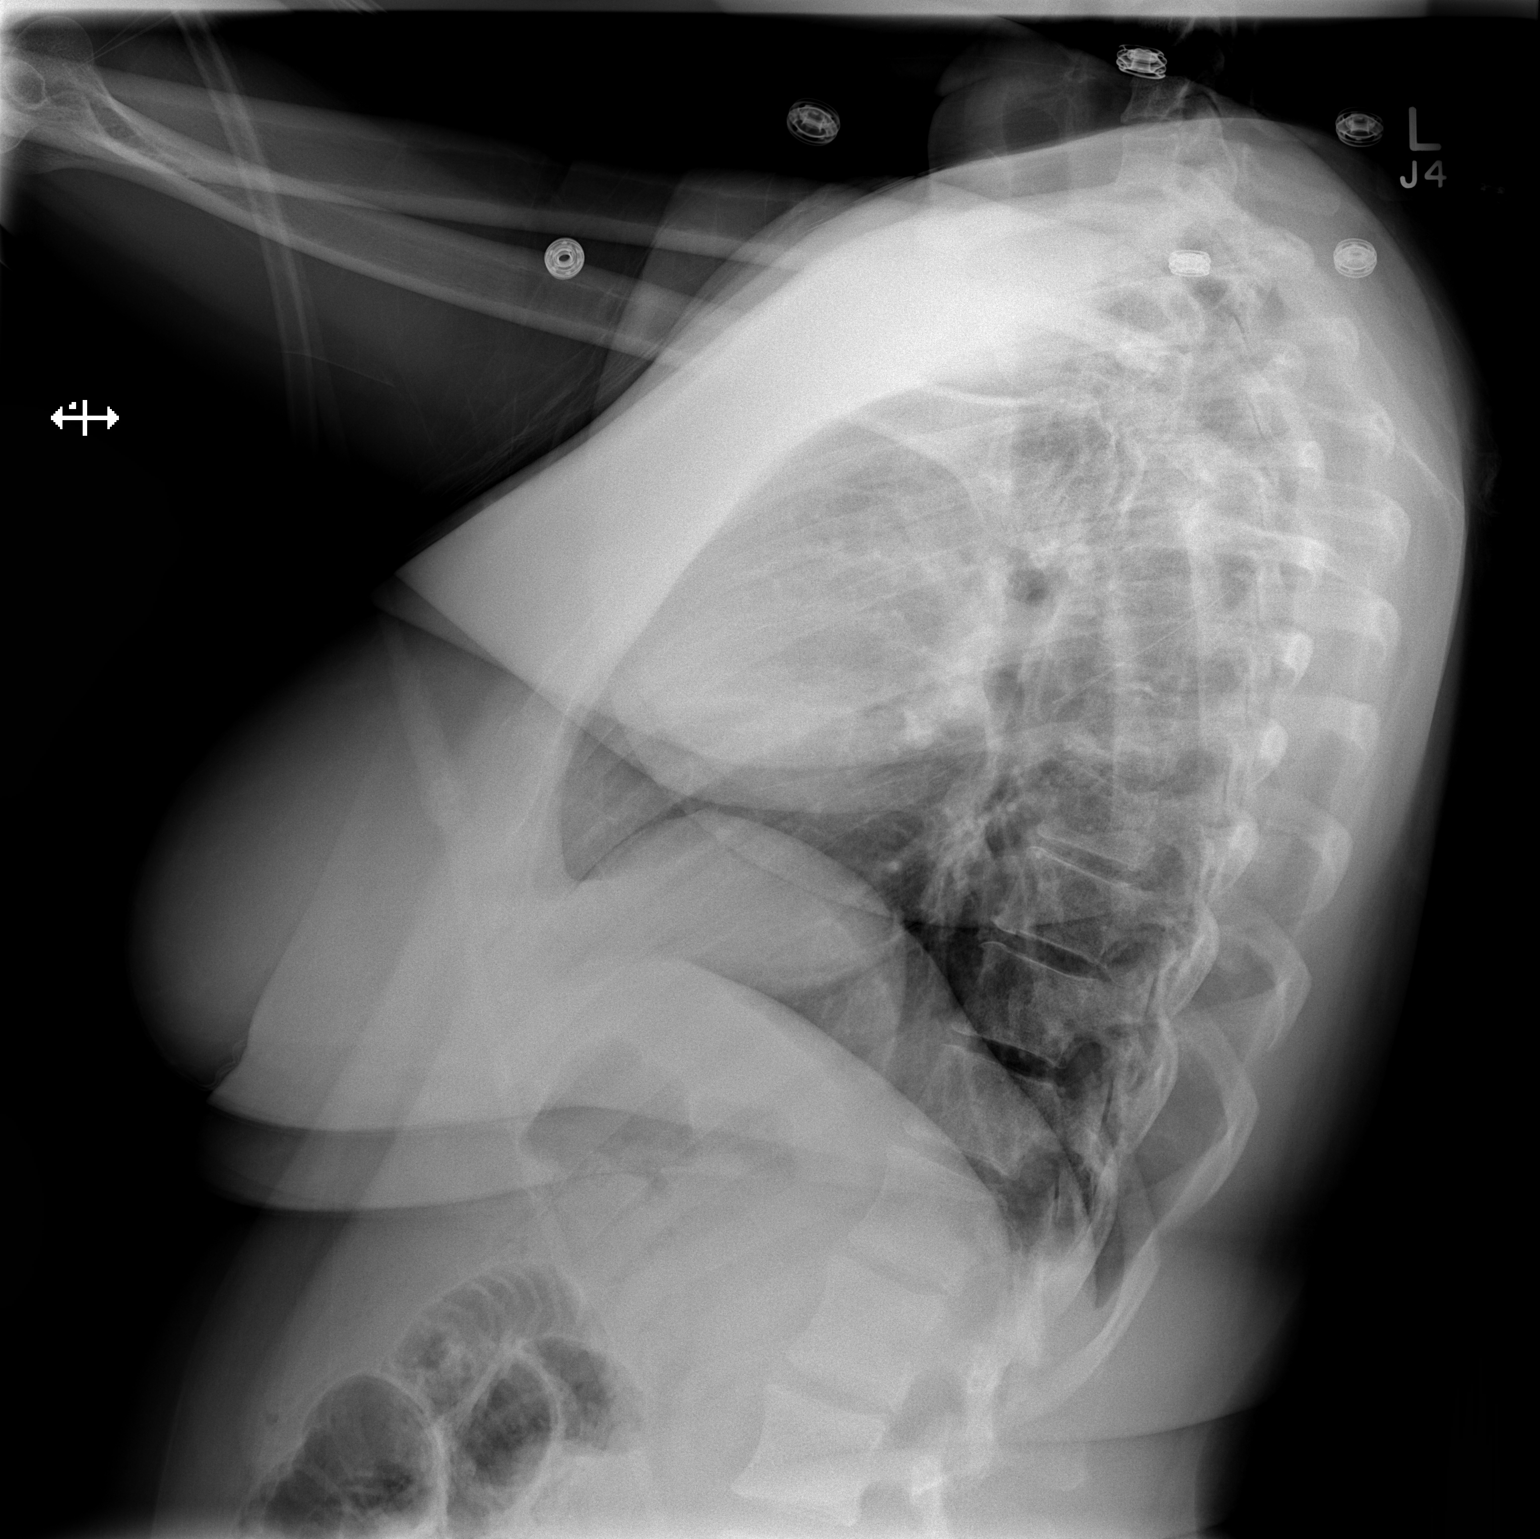

[2 of 2 positions shown; findings below may reference images not displayed]

FINDINGS: Normal cardiac and mediastinal silhouette. Clear lung fields. No
bony abnormality.
IMPRESSION: No active cardiopulmonary disease.  Stable chest from priors.

## 2015-07-14 ENCOUNTER — Other Ambulatory Visit: Payer: Self-pay | Admitting: Family Medicine

## 2015-07-29 ENCOUNTER — Other Ambulatory Visit: Payer: Self-pay | Admitting: *Deleted

## 2015-07-29 MED ORDER — SITAGLIPTIN PHOSPHATE 100 MG PO TABS: ORAL_TABLET | ORAL | Status: DC

## 2015-07-29 MED ORDER — ATORVASTATIN CALCIUM 40 MG PO TABS: 40.0000 mg | ORAL_TABLET | Freq: Every day | ORAL | Status: DC

## 2015-07-29 MED ORDER — GLIPIZIDE 5 MG PO TABS: ORAL_TABLET | ORAL | Status: DC

## 2015-07-29 NOTE — Telephone Encounter (Signed)
Needs refill. Deseree Blount, CMA  

## 2015-08-20 ENCOUNTER — Ambulatory Visit: Payer: Medicaid Other | Admitting: Family Medicine

## 2015-09-09 ENCOUNTER — Telehealth: Payer: Self-pay | Admitting: *Deleted

## 2015-09-09 NOTE — Telephone Encounter (Signed)
Will forward to MD to advise on medication increase and to write a new script to match this change. Jazmin Hartsell,CMA

## 2015-09-09 NOTE — Telephone Encounter (Signed)
Pt was told to increase her glipizide to 2 pills per day at her last visit.  She was not given a new script with her increased dosage so therefore she is running out of her medication and cant get a refill until we approve it.   Of note, pt has been out of her meds for 5 days so she request to not have to come in for appointment until she gets and starts taking her medication Sara Brock, Sara RochesterJessica Brock, CMA

## 2015-09-10 MED ORDER — GLIPIZIDE 5 MG PO TABS: 5.0000 mg | ORAL_TABLET | Freq: Two times a day (BID) | ORAL | 0 refills | Status: DC

## 2015-09-12 ENCOUNTER — Telehealth: Payer: Self-pay | Admitting: Family Medicine

## 2015-09-12 NOTE — Telephone Encounter (Signed)
Pt says she was told by dr Deirdre Priestchambliss to come back in one month to check her a1c.  Dr Deirdre Priestchambliss has no appts in Sept.  Should she wait until Oct or see another dr? Please advise

## 2015-09-12 NOTE — Telephone Encounter (Signed)
Does she just need an a1c? Or does she need to see you as well, please advise.

## 2015-09-12 NOTE — Telephone Encounter (Signed)
Pls ask her to come in Aug 30  Thanks   LC

## 2015-09-12 NOTE — Telephone Encounter (Signed)
Spoke with her taking glipizide twice a day Will make an appt for one month

## 2015-09-15 NOTE — Telephone Encounter (Signed)
Pt has been scheduled for August 30th @9 :30am.

## 2015-10-01 ENCOUNTER — Ambulatory Visit (INDEPENDENT_AMBULATORY_CARE_PROVIDER_SITE_OTHER): Payer: Medicaid Other | Admitting: Family Medicine

## 2015-10-01 ENCOUNTER — Encounter: Payer: Self-pay | Admitting: Family Medicine

## 2015-10-01 VITALS — BP 107/69 | HR 68 | Temp 98.1°F | Wt 185.0 lb

## 2015-10-01 DIAGNOSIS — E118 Type 2 diabetes mellitus with unspecified complications: Secondary | ICD-10-CM | POA: Diagnosis not present

## 2015-10-01 DIAGNOSIS — Z23 Encounter for immunization: Secondary | ICD-10-CM

## 2015-10-01 DIAGNOSIS — E1165 Type 2 diabetes mellitus with hyperglycemia: Secondary | ICD-10-CM | POA: Diagnosis not present

## 2015-10-01 LAB — POCT GLYCOSYLATED HEMOGLOBIN (HGB A1C): Hemoglobin A1C: 11.2

## 2015-10-01 NOTE — Progress Notes (Signed)
Subjective  Patient is presenting with the following illnesses  DIABETES Disease Monitoring: Blood Sugar ranges(Severity) -not checking regularly  Associated Symptoms- Polyuria/phagia/dipsia- no      Visual problems- no Medications: Compliance(Modifying factor) - reports taking glipizide twice a day except for 4 days earlier this month when ran out.  Daily Januvia.   Hypoglycemic symptoms- no Timing - continuous  Chief Complaint noted Review of Symptoms - see HPI PMH - Smoking status noted.     Objective Vital Signs reviewed Psych:  Cognition and judgment appear intact. Alert, communicative  and cooperative with normal attention span and concentration. No apparent delusions, illusions, hallucinations      Assessments/Plans  No problem-specific Assessment & Plan notes found for this encounter.   See Encounter view if individual problem A/Ps not visible See after visit summary for details of patient instuctions

## 2015-10-01 NOTE — Patient Instructions (Signed)
Good to see you today!  Thanks for coming in.  I will call you about the next medicine  Come back in 3 months

## 2015-10-01 NOTE — Assessment & Plan Note (Addendum)
Not at goal.  She is very reluctant to take insulin.  Reports her father has hard to control diabetes.  Will investigate additional oral agent

## 2015-10-08 ENCOUNTER — Telehealth: Payer: Self-pay | Admitting: Family Medicine

## 2015-10-08 NOTE — Telephone Encounter (Signed)
Will forward to MD to advise. Dinesha Twiggs,CMA  

## 2015-10-08 NOTE — Telephone Encounter (Signed)
Pt is calling because she was waiting to hear back from the doctor her medication. She said he was going to call her after he researched this and then let her know. jw

## 2015-10-09 MED ORDER — EMPAGLIFLOZIN 10 MG PO TABS: 10.0000 mg | ORAL_TABLET | Freq: Every day | ORAL | 2 refills | Status: DC

## 2015-10-09 NOTE — Telephone Encounter (Signed)
Called Cell - No anwer Left message  Will call her back  Called back.  Will start jardiance and see if Medicaid will cover  Likely will need PA  Discussed side effects of yeast infection and low blood sugar and to call us if have any or if can not get medication

## 2015-10-10 ENCOUNTER — Telehealth: Payer: Self-pay | Admitting: *Deleted

## 2015-10-10 NOTE — Telephone Encounter (Signed)
PA pending for Jardiance per [place] [facility].  Sara Brock, Sara Weathington L, RN

## 2015-10-10 NOTE — Telephone Encounter (Signed)
Prior Authorization received from [facility] for Jardiance 10 mg. Formulary and PA form placed in provider box for completion. Clovis PuMartin, Tamika L, RN

## 2015-10-15 MED ORDER — CANAGLIFLOZIN 100 MG PO TABS: 100.0000 mg | ORAL_TABLET | Freq: Every day | ORAL | 3 refills | Status: DC

## 2015-10-15 NOTE — Telephone Encounter (Signed)
Rx written for invokana  Please let her know  Call me with any problems  Thanks

## 2015-10-15 NOTE — Telephone Encounter (Signed)
LM for patient to call back.  Will inform her of medication change and reason why when she returns call. Itay Mella,CMA

## 2015-10-15 NOTE — Telephone Encounter (Signed)
PA for Jardiance 10 mg was denied via [place] [employer].  Patient much have tried and failed Invokamet or Invokana before medication can be approved.  Clovis PuMartin, Caison Hearn L, RN

## 2015-10-15 NOTE — Addendum Note (Signed)
Addended by: Pearlean BrownieHAMBLISS, Kaizer Dissinger L on: 10/15/2015 09:43 AM   Modules accepted: Orders

## 2015-10-19 ENCOUNTER — Emergency Department (HOSPITAL_COMMUNITY)
Admission: EM | Admit: 2015-10-19 | Discharge: 2015-10-19 | Disposition: A | Discharge status: Home / Self Care | Payer: Medicaid Other | Attending: Emergency Medicine | Admitting: Emergency Medicine

## 2015-10-19 ENCOUNTER — Encounter (HOSPITAL_COMMUNITY): Payer: Self-pay

## 2015-10-19 DIAGNOSIS — R51 Headache: Secondary | ICD-10-CM | POA: Insufficient documentation

## 2015-10-19 DIAGNOSIS — E119 Type 2 diabetes mellitus without complications: Secondary | ICD-10-CM | POA: Diagnosis not present

## 2015-10-19 DIAGNOSIS — Z7984 Long term (current) use of oral hypoglycemic drugs: Secondary | ICD-10-CM | POA: Insufficient documentation

## 2015-10-19 DIAGNOSIS — J029 Acute pharyngitis, unspecified: Secondary | ICD-10-CM | POA: Insufficient documentation

## 2015-10-19 DIAGNOSIS — R519 Headache, unspecified: Secondary | ICD-10-CM

## 2015-10-19 LAB — RAPID STREP SCREEN (MED CTR MEBANE ONLY): STREPTOCOCCUS, GROUP A SCREEN (DIRECT): NEGATIVE

## 2015-10-19 MED ORDER — DEXAMETHASONE SODIUM PHOSPHATE 10 MG/ML IJ SOLN
10.0000 mg | Freq: Once | INTRAMUSCULAR | Status: AC
  Administered 2015-10-19: 10 mg via INTRAVENOUS
  Filled 2015-10-19: qty 1

## 2015-10-19 MED ORDER — DIPHENHYDRAMINE HCL 50 MG/ML IJ SOLN
25.0000 mg | Freq: Once | INTRAMUSCULAR | Status: AC
  Administered 2015-10-19: 25 mg via INTRAVENOUS
  Filled 2015-10-19: qty 1

## 2015-10-19 MED ORDER — PROCHLORPERAZINE EDISYLATE 5 MG/ML IJ SOLN
10.0000 mg | Freq: Once | INTRAMUSCULAR | Status: AC
  Administered 2015-10-19: 10 mg via INTRAVENOUS
  Filled 2015-10-19: qty 2

## 2015-10-19 MED ORDER — SODIUM CHLORIDE 0.9 % IV BOLUS (SEPSIS)
1000.0000 mL | Freq: Once | INTRAVENOUS | Status: AC
  Administered 2015-10-19: 1000 mL via INTRAVENOUS

## 2015-10-19 NOTE — ED Provider Notes (Signed)
[facility]-EMERGENCY DEPT Provider Note   CSN: [id number] Arrival date & time: 10/19/15  8295     History   Chief Complaint Chief Complaint  Patient presents with  . Headache    HPI Sara Brock is a 35 y.o. female with a past medical history significant for diabetes and allergies who presents with sore throat, congestion, and headache. Patient reports that she was started on a new medication regimen for her diabetes several days ago however, last 2 days, patient has had headaches. Patient describes the headache as in the front of her head, pressure-like, associated with severe photophobia, and it is constant. Patient denies any recent trauma, denies any fevers, denies any vision changes, neurologic deficits, gait abnormalities, or any other problems. Patient denies nausea, denies vomiting, and denies taking any medicine to help her symptoms. Patient denies any allergies. Patient denies any difficulty moving her neck or any neck pain. Patient also denies chest pain, shortness of breath, constipation, diarrhea, dysuria, or rashes. Sore throat, patient feels burning she is able to tolerate solids, fluids, and has no difficulty breathing. Patient does not feel that her throat is swollen.  The history is provided by the patient and medical records. No language interpreter was used.  Headache   This is a new problem. The current episode started 2 days ago. The problem occurs constantly. The problem has not changed since onset.The headache is associated with bright light. The pain is located in the frontal region. The quality of the pain is described as dull. The pain is at a severity of 8/10. The pain is severe. The pain does not radiate. Pertinent negatives include no fever, no malaise/fatigue, no chest pressure, no near-syncope, no palpitations, no syncope, no shortness of breath, no nausea and no vomiting. She has tried nothing for the symptoms. The treatment provided no relief.  Sore Throat    This is a new problem. The current episode started 2 days ago. The problem occurs constantly. The problem has not changed since onset.Associated symptoms include headaches. Pertinent negatives include no chest pain, no abdominal pain and no shortness of breath. The symptoms are aggravated by swallowing. Nothing relieves the symptoms. She has tried nothing for the symptoms. The treatment provided no relief.    Past Medical History:  Diagnosis Date  . Abscess   . Diabetes mellitus   . Seasonal allergies     Patient Active Problem List   Diagnosis Date Noted  . Major depressive disorder, recurrent episode (HCC) 04/11/2015  . Marijuana abuse 04/11/2015  . Adjustment disorder with mixed anxiety and depressed mood 03/29/2014  . DM (diabetes mellitus), type 2, uncontrolled (HCC) 10/14/2011    Past Surgical History:  Procedure Laterality Date  . TUBAL LIGATION      OB History    No data available       Home Medications    Prior to Admission medications   Medication Sig Start Date End Date Taking? Authorizing Provider  atorvastatin (LIPITOR) 40 MG tablet Take 1 tablet (40 mg total) by mouth daily at 6 PM. 07/29/15   Carney Living, MD  canagliflozin (INVOKANA) 100 MG TABS tablet Take 1 tablet (100 mg total) by mouth daily before breakfast. 10/15/15   Carney Living, MD  glipiZIDE (GLUCOTROL) 5 MG tablet Take 1 tablet (5 mg total) by mouth 2 (two) times daily before a meal. 09/10/15   Carney Living, MD  sitaGLIPtin (JANUVIA) 100 MG tablet TAKE 1 TABLET (100 MG TOTAL) BY MOUTH DAILY.  07/29/15   Carney Living, MD    Family History Family History  Problem Relation Age of Onset  . Hypertension Mother   . Hyperlipidemia Mother   . Diabetes Father   . Diabetes Brother   . Diabetes      1ST DEGREE RELATIVE    Social History Social History  Substance Use Topics  . Smoking status: Never Smoker  . Smokeless tobacco: Never Used  . Alcohol use No      Allergies   Review of patient's allergies indicates no known allergies.   Review of Systems Review of Systems  Constitutional: Negative for appetite change, chills, diaphoresis, fatigue, fever and malaise/fatigue.  HENT: Positive for congestion and rhinorrhea.   Eyes: Positive for photophobia. Negative for visual disturbance.  Respiratory: Negative for chest tightness, shortness of breath, wheezing and stridor.   Cardiovascular: Negative for chest pain, palpitations, leg swelling, syncope and near-syncope.  Gastrointestinal: Negative for abdominal pain, constipation, diarrhea, nausea and vomiting.  Genitourinary: Negative for dysuria.  Musculoskeletal: Negative for back pain, myalgias, neck pain and neck stiffness.  Skin: Negative for wound.  Neurological: Positive for headaches. Negative for dizziness, tremors, facial asymmetry, weakness, light-headedness and numbness.  Psychiatric/Behavioral: Negative for agitation.  All other systems reviewed and are negative.    Physical Exam Updated Vital Signs BP 109/77 (BP Location: Left Arm)   Pulse 98   Temp 98.1 F (36.7 C) (Oral)   Resp 18   Ht 5\' 4"  (1.626 m)   Wt 180 lb (81.6 kg)   LMP 09/11/2015   SpO2 100%   BMI 30.90 kg/m   Physical Exam  Constitutional: She is oriented to person, place, and time. She appears well-developed and well-nourished. No distress.  HENT:  Head: Atraumatic.  Right Ear: External ear normal.  Left Ear: External ear normal.  Nose: Rhinorrhea present.  Mouth/Throat: Posterior oropharyngeal erythema present. No oropharyngeal exudate.  Eyes: Conjunctivae and EOM are normal. Pupils are equal, round, and reactive to light.  Neck: Normal range of motion. Neck supple.  Cardiovascular: Normal rate and regular rhythm.   No murmur heard. Pulmonary/Chest: Effort normal and breath sounds normal. No stridor. No respiratory distress. She has no wheezes. She has no rales. She exhibits no tenderness.   Abdominal: Soft. There is no tenderness.  Musculoskeletal: She exhibits no edema.  Neurological: She is alert and oriented to person, place, and time. She has normal reflexes. She displays normal reflexes. No cranial nerve deficit. She exhibits normal muscle tone. Coordination normal.  Skin: Skin is warm and dry. No rash noted.  Psychiatric: She has a normal mood and affect.  Nursing note and vitals reviewed.    ED Treatments / Results  Labs (all labs ordered are listed, but only abnormal results are displayed) Labs Reviewed  RAPID STREP SCREEN (NOT AT [facility])  CULTURE, GROUP A STREP [facility])    EKG  EKG Interpretation None       Radiology No results found.  Procedures Procedures (including critical care time)  Medications Ordered in ED Medications  sodium chloride 0.9 % bolus 1,000 mL (0 mLs Intravenous Stopped 10/19/15 1312)  prochlorperazine (COMPAZINE) injection 10 mg (10 mg Intravenous Given 10/19/15 1031)  diphenhydrAMINE (BENADRYL) injection 25 mg (25 mg Intravenous Given 10/19/15 1030)  dexamethasone (DECADRON) injection 10 mg (10 mg Intravenous Given 10/19/15 1030)     Initial Impression / Assessment and Plan / ED Course  I have reviewed the triage vital signs and the nursing notes.  Pertinent labs &  imaging results that were available during my care of the patient were reviewed by me and considered in my medical decision making (see chart for details).  Clinical Course    Sara Brock is a 35 y.o. female with a past medical history significant for diabetes and allergies who presents with sore throat, congestion, and headache. History and exam as above. Based on description of headache and unremarkable neurologic exam, patients headache felt to be migraine versus tension. Photophobia supported migraine as etiology of headache. Patient had no neck stiffness, nuchal rigidity, Fevers, nausea/vomiting, or neck tenderness. Doubt meningitis.  Patient given  headache cocktail including fluids, steroids, Benadryl, And Compazine. For sore throat with mild oropharyngeal redness and lack of cough, strep swab sent. Strep test negative.  On reassessment, patient reports headaches resolved. Patient reassured a symptom likely secondary to our role URI as cause of sore throat. Patient also reassured that headache is likely secondary to migraine. Patient given instructions to follow up with PCP for further management. Patient given strict return precautions for any new or worsening symptoms.  Patient agreed plan of discharged and had no other Questions or concerns. Patient discharged in good condition.     Final Clinical Impressions(s) / ED Diagnoses   Final diagnoses:  Acute nonintractable headache, unspecified headache type  Sore throat    New Prescriptions Discharge Medication List as of 10/19/2015  1:31 PM      Clinical Impression: 1. Acute nonintractable headache, unspecified headache type   2. Sore throat     Disposition: Discharge  Condition: Good  I have discussed the results, Dx and Tx plan with the pt(& family if present). He/she/they expressed understanding and agree(s) with the plan. Discharge instructions discussed at great length. Strict return precautions discussed and pt &/or family have verbalized understanding of the instructions. No further questions at time of discharge.    Discharge Medication List as of 10/19/2015  1:31 PM      Follow Up: Carney LivingMarshall L Chambliss, MD [address] [place] [address] [phone]  Schedule an appointment as soon as possible for a visit  If symptoms worsenplease return to the nearest ED.     Canary Brimhristopher J Kenric Ginger, MD 10/19/15 2154

## 2015-10-19 NOTE — ED Notes (Signed)
MD at bedside. 

## 2015-10-19 NOTE — ED Triage Notes (Signed)
Per Pt, Pt is coming from home with complaints of headache, sore throat, and generalized not feeling well that started two days ago. Pt recently changed medication and started taking it two days ago.

## 2015-10-20 ENCOUNTER — Telehealth: Payer: Self-pay | Admitting: Family Medicine

## 2015-10-20 ENCOUNTER — Telehealth: Payer: Self-pay | Admitting: Internal Medicine

## 2015-10-20 NOTE — Telephone Encounter (Signed)
Pt is wondering if she is suppose to take both Jan uva and Invokana.  She was in the hospital Sunday for dehrdrationg and low blood sugar.  .Marland Kitchen

## 2015-10-20 NOTE — Telephone Encounter (Signed)
After Hours/ Emergency Line Call  Received a call from Sara Brock stating that she is unsure what diabetes medications she is supposed to be taking. She was recently started on Jardiance by her PCP for uncontrolled diabetes (however, on chart review, it looks like Invokana is listed as a medication too?). When she got to the pharmacy, she had two medications waiting for her- Theodis Satonvokana and Jardiance. She took both of these medications in addition to her Glipizide and Januvia. She then started feeling terrible and ended up in the ED. She is wondering what medication she should be taking- Invokana or Jardiance? I advised her to take just her Glipizide and Januvia until she hears back from her PCP. Will forward to PCP.  [name], MD PGY-2, [facility]

## 2015-10-21 NOTE — Telephone Encounter (Signed)
Spoke with her  Feeling ok except for a cold  She received both invokana and jardiance from the pharmacy.  Apparently medicaid approved both?  Told her to only take jardiance, [place]uvia and glipizide  She agrees.

## 2015-10-22 LAB — CULTURE, GROUP A STREP (THRC)

## 2015-11-10 NOTE — Telephone Encounter (Signed)
Left message on vm to call if  Having in troubles with medication

## 2015-12-04 ENCOUNTER — Other Ambulatory Visit: Payer: Self-pay | Admitting: Family Medicine

## 2015-12-16 ENCOUNTER — Ambulatory Visit: Payer: Medicaid Other | Admitting: Family Medicine

## 2015-12-23 ENCOUNTER — Encounter: Payer: Self-pay | Admitting: Family Medicine

## 2015-12-23 ENCOUNTER — Other Ambulatory Visit: Payer: Self-pay | Admitting: Family Medicine

## 2015-12-23 ENCOUNTER — Ambulatory Visit (INDEPENDENT_AMBULATORY_CARE_PROVIDER_SITE_OTHER): Payer: Medicaid Other | Admitting: Family Medicine

## 2015-12-23 VITALS — BP 109/67 | HR 82 | Temp 97.8°F | Wt 185.0 lb

## 2015-12-23 DIAGNOSIS — E1165 Type 2 diabetes mellitus with hyperglycemia: Secondary | ICD-10-CM | POA: Diagnosis present

## 2015-12-23 DIAGNOSIS — E118 Type 2 diabetes mellitus with unspecified complications: Secondary | ICD-10-CM

## 2015-12-23 MED ORDER — JARDIANCE 10 MG PO TABS: 10.0000 mg | ORAL_TABLET | Freq: Every day | ORAL | 1 refills | Status: DC

## 2015-12-23 MED ORDER — GLIPIZIDE 5 MG PO TABS: 5.0000 mg | ORAL_TABLET | Freq: Two times a day (BID) | ORAL | 1 refills | Status: DC

## 2015-12-23 MED ORDER — ATORVASTATIN CALCIUM 40 MG PO TABS: 40.0000 mg | ORAL_TABLET | Freq: Every day | ORAL | 1 refills | Status: DC

## 2015-12-23 MED ORDER — SITAGLIPTIN PHOSPHATE 100 MG PO TABS: ORAL_TABLET | ORAL | 1 refills | Status: DC

## 2015-12-23 NOTE — Progress Notes (Signed)
   HPI  CC: Diabetes medication refill Patient is here for medication refill. She states she has been doing well on her new diabetic regimen. She denies any worrisome side effects at this time. She endorses one episode of "low blood sugar" to the mid 90s but otherwise has not had any issues. She endorses good compliance at this time. She endorses improved exercise recently. No nausea, vomiting, diarrhea, even her, chills, headache, blurred vision, lightheadedness, dizziness, or dysuria.  Review of Systems    See HPI for ROS. All other systems reviewed and are negative.  CC, SH/smoking status, and VS noted  Objective: BP 109/67   Pulse 82   Temp 97.8 F (36.6 C) (Oral)   Wt 185 lb (83.9 kg)   LMP 12/22/2015   SpO2 100%   BMI 31.76 kg/m  Gen: NAD, alert, cooperative, and pleasant. CV: RRR, no murmur Resp: CTAB, no wheezes, non-labored Ext: No edema, warm   Assessment and plan:  DM (diabetes mellitus), type 2, uncontrolled (HCC) Improving: Patient has improving A1c (11.2 >> 9.0) at this time with her current medication regimen. She would just like a refill on her medications. She is endorsing good tolerance with her current regimen. - Refilled medications - Follow-up with PCP in 3 months   Orders Placed This Encounter  Procedures  . HgB A1c    Meds ordered this encounter  Medications  . atorvastatin (LIPITOR) 40 MG tablet    Sig: Take 1 tablet (40 mg total) by mouth daily at 6 PM.    Dispense:  90 tablet    Refill:  1  . glipiZIDE (GLUCOTROL) 5 MG tablet    Sig: Take 1 tablet (5 mg total) by mouth 2 (two) times daily before a meal.    Dispense:  180 tablet    Refill:  1  . JARDIANCE 10 MG TABS tablet    Sig: Take 10 mg by mouth daily.    Dispense:  90 tablet    Refill:  1  . sitaGLIPtin (JANUVIA) 100 MG tablet    Sig: TAKE 1 TABLET (100 MG TOTAL) BY MOUTH DAILY.    Dispense:  90 tablet    Refill:  1     Kathee DeltonIan D Lev Cervone, MD,MS,  PGY3 12/30/2015 5:54 PM

## 2015-12-23 NOTE — Assessment & Plan Note (Addendum)
Improving: Patient has improving A1c (11.2 >> 9.0) at this time with her current medication regimen. She would just like a refill on her medications. She is endorsing good tolerance with her current regimen. - Refilled medications - Follow-up with PCP in 3 months

## 2015-12-23 NOTE — Patient Instructions (Signed)
It was a pleasure seeing you today in our clinic. Today we discussed your diabetes. Here is the treatment plan we have discussed and agreed upon together:   - I've refilled your diabetes medications today. - You should be very pleased with yourself as you're A1c has made significant improvements since your last visit. - Beware of any low blood sugar events. If these seem to increase in frequency then I would strongly encourage you to call Dr. Deirdre Priesthambliss and inform him of these events as he may want to make changes in your medications at that time. - Continue exercising and watching what you are eating.

## 2015-12-29 ENCOUNTER — Telehealth: Payer: Self-pay | Admitting: Family Medicine

## 2015-12-29 NOTE — Telephone Encounter (Signed)
Eye Exam report from [facility], Dr. Le, has been requested. Office will fax to our office.  °  °Sara Brock °

## 2015-12-29 NOTE — Telephone Encounter (Signed)
-----   Message from Carney LivingMarshall L Chambliss, MD sent at 12/29/2015  2:12 PM EST ----- Regarding: Eye Exam [facility]

## 2015-12-31 ENCOUNTER — Ambulatory Visit: Payer: Medicaid Other | Admitting: Family Medicine

## 2016-01-01 NOTE — Telephone Encounter (Signed)
Report placed in PCP box.  °

## 2016-03-11 IMAGING — CR DG CHEST 2V
2 series · 2 of 2 positions shown · non-contrast
Comparison: 07/10/2013

CLINICAL DATA: Short of breath.

EXAM:
CHEST  2 VIEW

[w chest pa]
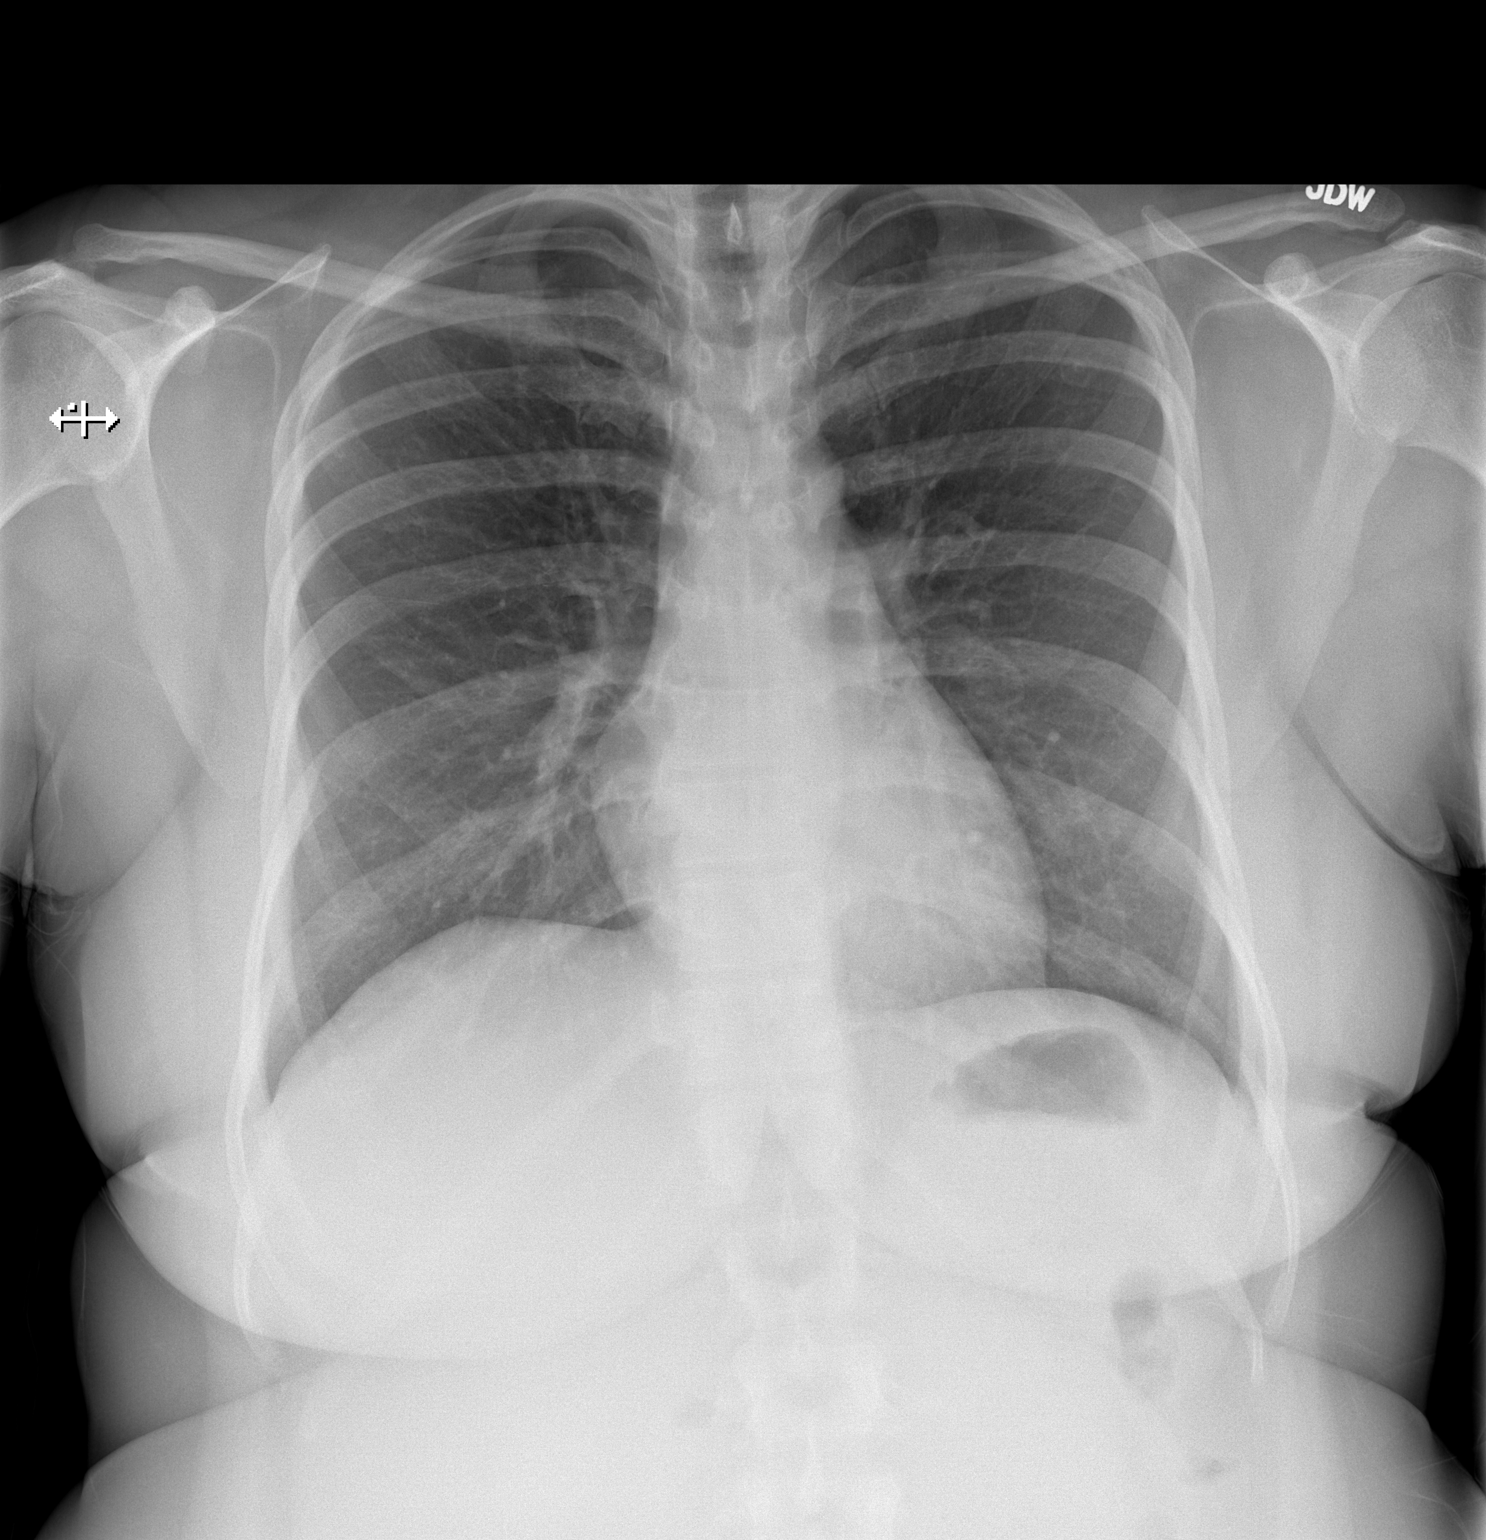

[w chest lat]
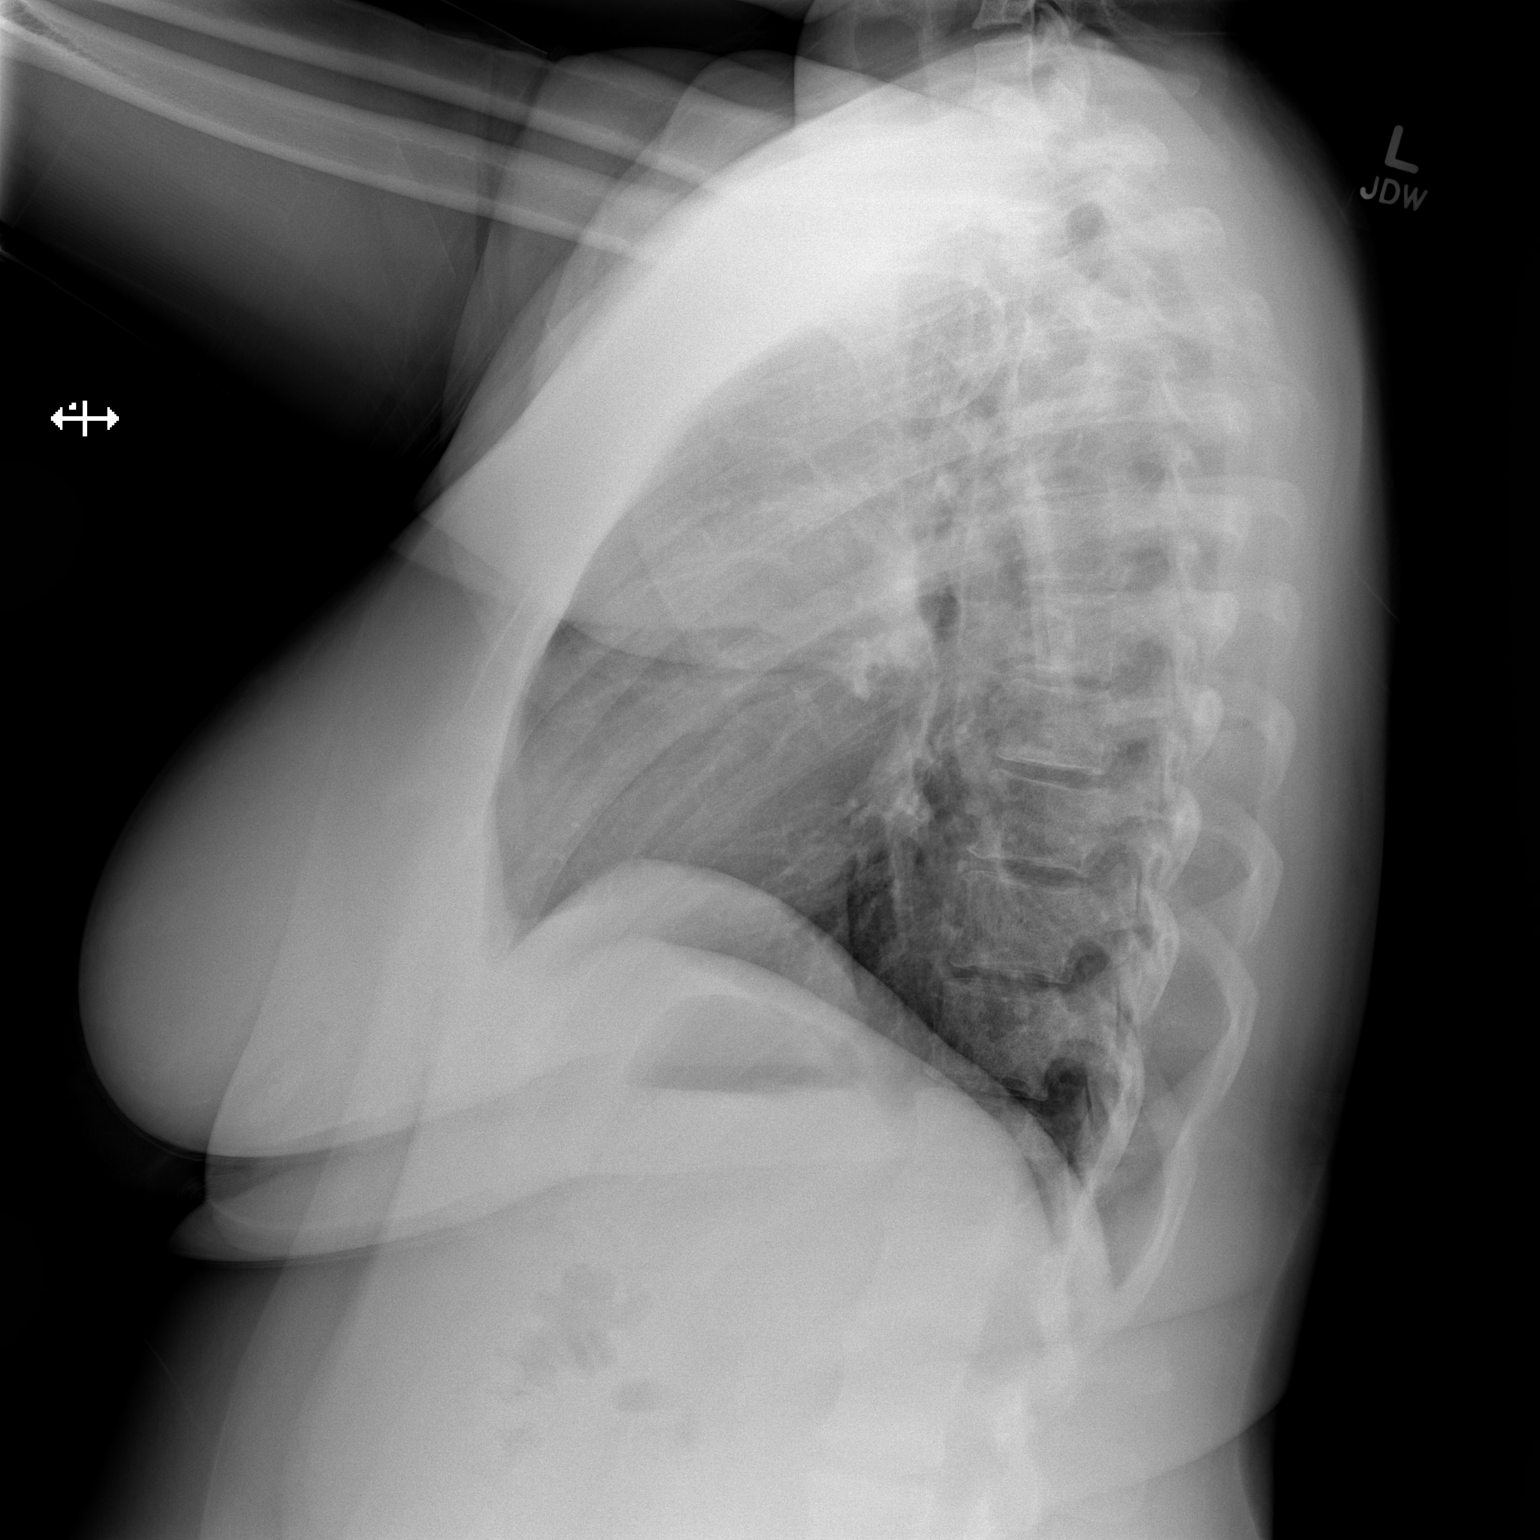

[2 of 2 positions shown; findings below may reference images not displayed]

FINDINGS: The heart size and mediastinal contours are within normal limits.
Both lungs are clear. The visualized skeletal structures are
unremarkable.
IMPRESSION: No active cardiopulmonary disease.

## 2016-03-29 ENCOUNTER — Telehealth: Payer: Self-pay | Admitting: Family Medicine

## 2016-03-29 NOTE — Telephone Encounter (Signed)
Pt is calling because she is out of her Klonopin. She does have an appointment on 04/21/16, but she is out of her medication. Can we call in enough to get her to the appointment on 04/21/16? Please inform patient of what she needs to do. jw

## 2016-03-29 NOTE — Telephone Encounter (Signed)
Please let her know we do not have that on her medication list?  When and by who was it prescribed  Thanks  LC

## 2016-03-30 ENCOUNTER — Encounter (HOSPITAL_COMMUNITY): Payer: Self-pay | Admitting: Emergency Medicine

## 2016-03-30 ENCOUNTER — Ambulatory Visit (HOSPITAL_COMMUNITY)
Admission: EM | Admit: 2016-03-30 | Discharge: 2016-03-30 | Disposition: A | Discharge status: Home / Self Care | Payer: Medicaid Other | Attending: Family Medicine | Admitting: Family Medicine

## 2016-03-30 DIAGNOSIS — F411 Generalized anxiety disorder: Secondary | ICD-10-CM | POA: Diagnosis not present

## 2016-03-30 DIAGNOSIS — Z760 Encounter for issue of repeat prescription: Secondary | ICD-10-CM

## 2016-03-30 HISTORY — DX: Anxiety disorder, unspecified: F41.9

## 2016-03-30 MED ORDER — CLONAZEPAM 0.5 MG PO TABS: 0.5000 mg | ORAL_TABLET | Freq: Two times a day (BID) | ORAL | 2 refills | Status: DC | PRN

## 2016-03-30 MED ORDER — FLUOXETINE HCL 20 MG PO TABS: 20.0000 mg | ORAL_TABLET | Freq: Every day | ORAL | 3 refills | Status: DC

## 2016-03-30 NOTE — Telephone Encounter (Signed)
Pt is calling back because she only takes this occasionally. This was prescribed 03/29/14 by Dr. Lum BabeEniola. She would like to speak to the doctor ASAP. jw

## 2016-03-30 NOTE — ED Triage Notes (Signed)
[facility] of needing a refill on her anxiety medication. The patient stated that she is out of her anxiety medication and can not get an appointment until March. The patient stated that she was not sure of the medication strength.

## 2016-03-30 NOTE — Discharge Instructions (Signed)
Feel free to return if the medications are not working

## 2016-03-30 NOTE — ED Provider Notes (Signed)
[facility]    CSN: [id number] Arrival date & time: 03/30/16  1526     History   Chief Complaint Chief Complaint  Patient presents with  . Anxiety  . Medication Refill    HPI Sara Brock is a 36 y.o. female.   The patient presented to the [facility] with a complaint of needing a refill on her anxiety medication. The patient stated that she is out of her anxiety medication and can not get an appointment until March. The patient stated that she was not sure of the medication strength.    Patient has seen Dr. Deirdre Priest in the past and was doing well on clonazepam and fluoxetine. She ran out of these medicines several months ago and has had a downward spiral. She works for an [employer], and is a single parent taking care of 2 children age 33 and 26. She's found that she is crying much of the time but is not suicidal. She's not sleeping well.      Past Medical History:  Diagnosis Date  . Abscess   . Anxiety   . Diabetes mellitus   . Seasonal allergies     Patient Active Problem List   Diagnosis Date Noted  . Major depressive disorder, recurrent episode (HCC) 04/11/2015  . Marijuana abuse 04/11/2015  . Adjustment disorder with mixed anxiety and depressed mood 03/29/2014  . DM (diabetes mellitus), type 2, uncontrolled (HCC) 10/14/2011    Past Surgical History:  Procedure Laterality Date  . TUBAL LIGATION      OB History    No data available       Home Medications    Prior to Admission medications   Medication Sig Start Date End Date Taking? Authorizing Provider  atorvastatin (LIPITOR) 40 MG tablet Take 1 tablet (40 mg total) by mouth daily at 6 PM. 12/23/15  Yes Kathee Delton, MD  glipiZIDE (GLUCOTROL) 5 MG tablet Take 1 tablet (5 mg total) by mouth 2 (two) times daily before a meal. 12/23/15  Yes Kathee Delton, MD  JARDIANCE 10 MG TABS tablet Take 10 mg by mouth daily. 12/23/15  Yes Kathee Delton, MD  sitaGLIPtin (JANUVIA) 100 MG tablet TAKE 1  TABLET (100 MG TOTAL) BY MOUTH DAILY. 12/23/15  Yes Kathee Delton, MD  clonazePAM (KLONOPIN) 0.5 MG tablet Take 1 tablet (0.5 mg total) by mouth 2 (two) times daily as needed for anxiety. 03/30/16   Elvina Sidle, MD  FLUoxetine (PROZAC) 20 MG tablet Take 1 tablet (20 mg total) by mouth daily. 03/30/16   Elvina Sidle, MD    Family History Family History  Problem Relation Age of Onset  . Hypertension Mother   . Hyperlipidemia Mother   . Diabetes Father   . Diabetes Brother   . Diabetes      1ST DEGREE RELATIVE    Social History Social History  Substance Use Topics  . Smoking status: Never Smoker  . Smokeless tobacco: Never Used  . Alcohol use No     Allergies   Patient has no known allergies.   Review of Systems Review of Systems  Constitutional: Negative.   Respiratory: Positive for chest tightness.   Musculoskeletal: Positive for neck stiffness.  Psychiatric/Behavioral: Positive for agitation and dysphoric mood. Negative for self-injury.     Physical Exam Triage Vital Signs ED Triage Vitals  Enc Vitals Group     BP 03/30/16 1544 115/82     Pulse Rate 03/30/16 1544 76  Resp 03/30/16 1544 18     Temp 03/30/16 1544 98.5 F (36.9 C)     Temp Source 03/30/16 1544 Oral     SpO2 03/30/16 1544 100 %     Weight --      Height --      Head Circumference --      Peak Flow --      Pain Score 03/30/16 1543 0     Pain Loc --      Pain Edu? --      Excl. in GC? --    No data found.   Updated Vital Signs BP 115/82 (BP Location: Right Arm)   Pulse 76   Temp 98.5 F (36.9 C) (Oral)   Resp 18   SpO2 100%    Physical Exam  Constitutional: She is oriented to person, place, and time. She appears well-developed and well-nourished.  HENT:  Head: Normocephalic.  Right Ear: External ear normal.  Left Ear: External ear normal.  Mouth/Throat: Oropharynx is clear and moist.  Eyes: Conjunctivae and EOM are normal. Pupils are equal, round, and reactive to light.    Neck: Normal range of motion. Neck supple.  Cardiovascular: Normal rate.   Pulmonary/Chest: Effort normal.  Musculoskeletal: Normal range of motion.  Neurological: She is alert and oriented to person, place, and time.  Skin: Skin is warm and dry.  Psychiatric:  25 minute interview. She brightened up after she was able to discuss her problems.  Nursing note and vitals reviewed.    UC Treatments / Results  Labs (all labs ordered are listed, but only abnormal results are displayed) Labs Reviewed - No data to display  EKG  EKG Interpretation None       Radiology No results found.  Procedures Procedures (including critical care time)  Medications Ordered in UC Medications - No data to display   Initial Impression / Assessment and Plan / UC Course  I have reviewed the triage vital signs and the nursing notes.  Pertinent labs & imaging results that were available during my care of the patient were reviewed by me and considered in my medical decision making (see chart for details).     Final Clinical Impressions(s) / UC Diagnoses   Final diagnoses:  Anxiety state  Medication refill    New Prescriptions New Prescriptions   CLONAZEPAM (KLONOPIN) 0.5 MG TABLET    Take 1 tablet (0.5 mg total) by mouth 2 (two) times daily as needed for anxiety.   FLUOXETINE (PROZAC) 20 MG TABLET    Take 1 tablet (20 mg total) by mouth daily.     Elvina SidleKurt Leigh Kaeding, MD 03/30/16 [phone]

## 2016-03-30 NOTE — Progress Notes (Signed)
Patient walked into clinic today and I was asked to see her, as she could not stay until 3:30 to see a doctor, and I had immediate availability. When I asked what brought her in, she started crying and stated that she is feeling very stressed and overwhelmed. She states that she is only able to sleep a couple of hours a night despite feeling exhausted, and that she has been irritable around others during the day because she has not slept well. Patient states she has felt this way for 2 months and has hit a breaking point. She wanted to see Dr. Deirdre Priesthambliss today to get a refill for Klonopin. She states she has been out of this medication for a while but feels she needs something now to help her calm down and be able to sleep. I offered to discuss relaxation techniques and/or to talk to preceptors about any availability for her to see a doctor today. Patient asked to see a doctor if possible. I precepted with Dr. McDiarmid, who reviewed the chart with me and explained that a doctor will not be able to see her here today to prescribe Klonopin. I returned to the patient and explained she couldn't be seen by a doctor today but that we could try to get her an appointment sometime this week. Patient walked out and stated that she was going to the ED.

## 2016-03-30 NOTE — Telephone Encounter (Signed)
Spoke with her she is upset stressed and concerned about std Will work her in at [employer]

## 2016-03-31 ENCOUNTER — Encounter: Payer: Self-pay | Admitting: Licensed Clinical Social Worker

## 2016-03-31 ENCOUNTER — Other Ambulatory Visit (HOSPITAL_COMMUNITY)
Admission: RE | Admit: 2016-03-31 | Discharge: 2016-03-31 | Disposition: A | Discharge status: Home / Self Care | Payer: Medicaid Other | Source: Ambulatory Visit | Attending: Family Medicine | Admitting: Family Medicine

## 2016-03-31 ENCOUNTER — Ambulatory Visit (INDEPENDENT_AMBULATORY_CARE_PROVIDER_SITE_OTHER): Payer: Medicaid Other | Admitting: Family Medicine

## 2016-03-31 DIAGNOSIS — Z202 Contact with and (suspected) exposure to infections with a predominantly sexual mode of transmission: Secondary | ICD-10-CM | POA: Diagnosis not present

## 2016-03-31 DIAGNOSIS — E118 Type 2 diabetes mellitus with unspecified complications: Secondary | ICD-10-CM

## 2016-03-31 DIAGNOSIS — E1165 Type 2 diabetes mellitus with hyperglycemia: Secondary | ICD-10-CM

## 2016-03-31 DIAGNOSIS — Z113 Encounter for screening for infections with a predominantly sexual mode of transmission: Secondary | ICD-10-CM | POA: Diagnosis not present

## 2016-03-31 DIAGNOSIS — F4323 Adjustment disorder with mixed anxiety and depressed mood: Secondary | ICD-10-CM

## 2016-03-31 LAB — POCT WET PREP (WET MOUNT)
Clue Cells Wet Prep Whiff POC: POSITIVE
TRICHOMONAS WET PREP HPF POC: ABSENT

## 2016-03-31 NOTE — Patient Instructions (Addendum)
Good to see you today!  Thanks for coming in.  If you are feeling worse particularly any feelings of hurting yourself please call us immediately  Stop your glipizide until you are eating normally again. Call us if you still feel you are having low blood sugars  I will call you if your lab tests are not normal.  Otherwise we will discuss them at your next visit.   See you in a few weeks

## 2016-03-31 NOTE — Progress Notes (Addendum)
Sara CarpenChristina M Brock is a 36 y.o. female  Dr.Chamblis requested a behavioral Health consult for the following:  depression and stress at home. [facility], patient appreciative of support offered, verbal consent received.  Pt. reports the following symptoms/concerns anxiety, decreased appetite, depression, fatigue, irritability, loss of interest in favorite activities and sleep disturbance.: anger, and crying daily.    Duration of current symptoms/ problem: started 4 months ago and progressively gotten worse in the past 2 months Age of onset or first mood disturbance: emotional since [age] or 10  Impact on function: difficult doing her job but is still able to go to work Previous out/inpatient treatment: out patient treatment on and off since a teenager.  Last out patient therapy was 4 years ago How often do you drink alcohol or beer: none What recreations drugs have you used in the past year? Cannabis weekly  Medical conditions that might explain or contribute to symptoms:diabetes, depression, anxiety  Assessments administered: phq9 =24 and GAD=20  LIFE CONTEXT:  Family & Social: Single parent, lives with her 2 minor children, has supportive family and faith community. School/ Work: works full time at an [employer] company  Life changes: break up of 14 year relationship, family member in Hospice, and stressors with children What is important to pt : patient's children are important to her, this is what keeps her going. 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   GOALS ADDRESSED:  1. How to Keep Going 2. Going back to [employer] 3. Feeling like old self again  ASSESSMENT/ RECOMMENDATION :  Pt currently experiencing depression. Symptoms exacerbated by psychosocial stressors of relationship break up and family stressors.  Pt may benefit from and is in agreement to receive  further assessment and [facility] interventions [facility].  Behavioral Health Intervention: Psychoeducation and Supportive Counseling, Reflective listening, and relaxed breathing  PLAN: 1. Patient will F/U with LCSW : March 8th  2. LCSW will F/U with patient via phone if she does not come to appointment [phone] 3. Behavioral Health meds: continue Prozac and klonopin as prescribed by PCP 4. Patient will work on the following behavioral recommendations: Relaxed breathing, and depression action plan. 5. Scale of 1-10, how likely are you to follow plan and implement Interventions: 10  Warm Hand Off Completed.      Sara Hineseborah Shakiah Wester, LCSW Licensed Clinical Social Worker [facility]   [phone] 10:22 AM

## 2016-03-31 NOTE — Progress Notes (Signed)
Subjective  Patient is presenting with the following illnesses  STD exposure Her long term partner and she have broken up and she is concerned she could have STDs.  Unsure if he had any.  No symptoms of discharge or sores or fever or rash  Anxiety Frequent crying, insomnia since breakup. No suicidal ideation.  Is going to work but having a hard time concentrating.  Got a refill on klonopin from UC yesterday. Took once several years ago during stressful time.    DIABETES Disease Monitoring: Blood Sugar ranges(Severity) -not checking but feels they are low since she in not eating as much due to stress  Associated Symptoms- Polyuria/phagia/dipsia- no      Visual problems- no Medications: Compliance(Modifying factor) - taking all meds Hypoglycemic symptoms- yes feels shaky and has to eat sweets which helps  Timing - continuous   Chief Complaint noted Review of Symptoms - see HPI PMH - Smoking status noted.     Objective Vital Signs reviewed Tearful Psych:  Cognition and judgment appear intact. Alert, communicative  and cooperative with normal attention span and concentration. No apparent delusions, illusions, hallucinations Genitalia:  Normal introitus for age, no external lesions, no vaginal discharge, mucosa pink and moist, no vaginal or cervical lesions, no vaginal atrophy, no friaility or hemorrhage, normal uterus size and position, no adnexal masses or tenderness  Assessments/Plans  No problem-specific Assessment & Plan notes found for this encounter.   See Encounter view if individual problem A/Ps not visible See after visit summary for details of patient instuctions

## 2016-04-01 ENCOUNTER — Telehealth: Payer: Self-pay

## 2016-04-01 ENCOUNTER — Telehealth: Payer: Self-pay | Admitting: Family Medicine

## 2016-04-01 LAB — RPR

## 2016-04-01 LAB — CERVICOVAGINAL ANCILLARY ONLY
CHLAMYDIA, DNA PROBE: NEGATIVE
Neisseria Gonorrhea: NEGATIVE

## 2016-04-01 LAB — HIV ANTIBODY (ROUTINE TESTING W REFLEX): HIV: NONREACTIVE

## 2016-04-01 MED ORDER — METRONIDAZOLE 500 MG PO TABS: 2000.0000 mg | ORAL_TABLET | Freq: Once | ORAL | 0 refills | Status: AC

## 2016-04-01 NOTE — Telephone Encounter (Signed)
Not our patient

## 2016-04-01 NOTE — Assessment & Plan Note (Signed)
Normal exam.  Will check labs

## 2016-04-01 NOTE — Telephone Encounter (Signed)
Told her no signs of std  She is feeling some better and will follow up with IC  She did want her probable BV treated Will rx metronidazole

## 2016-04-01 NOTE — Telephone Encounter (Signed)
Fluoxetine needs pa  [id number] covermymeds

## 2016-04-01 NOTE — Telephone Encounter (Signed)
Pharmacy advised  

## 2016-04-01 NOTE — Assessment & Plan Note (Signed)
Having a flare.  Will ask IC to see.  Continue current medications.  Close follow up

## 2016-04-01 NOTE — Assessment & Plan Note (Signed)
Having low blood sugar due to decreased appetitie due to anxiety.  Stop glipizide until this improves

## 2016-04-08 ENCOUNTER — Ambulatory Visit: Payer: Medicaid Other

## 2016-04-13 ENCOUNTER — Telehealth: Payer: Self-pay | Admitting: Licensed Clinical Social Worker

## 2016-04-13 NOTE — Progress Notes (Addendum)
[facility] follow up phone call.    [facility] last week due to double booking daughters appointment. Patient would like to continue seeing LCSW.  Appointment rescheduled for tomorrow at 8:30.  Sammuel Hineseborah Nansi [place], LCSW Licensed Clinical Social Worker [facility]   [phone] 2:38 PM

## 2016-04-14 ENCOUNTER — Encounter: Payer: Self-pay | Admitting: Licensed Clinical Social Worker

## 2016-04-14 ENCOUNTER — Ambulatory Visit (INDEPENDENT_AMBULATORY_CARE_PROVIDER_SITE_OTHER): Payer: Medicaid Other | Admitting: Licensed Clinical Social Worker

## 2016-04-14 DIAGNOSIS — F4323 Adjustment disorder with mixed anxiety and depressed mood: Secondary | ICD-10-CM

## 2016-04-14 NOTE — Progress Notes (Addendum)
Reason for follow-up: Continue brief intervention to address symptoms associated with depression and anxiety. Reports continued impairment in her daily functions at home and work. Patient presented tearful and states she is not feeling any better, still having difficulty managing her emotions, sleeping and controlling her anger. Depression screen [facility] 2/9 04/14/2016 03/31/2016 12/23/2015  Decreased Interest 3 3 0  Down, Depressed, Hopeless 2 3 0  PHQ - 2 Score 5 6 0  Altered sleeping 3 3 -  Tired, decreased energy 3 3 -  Change in appetite 3 3 -  Feeling bad or failure about yourself  1 3 -  Trouble concentrating 2 3 -  Moving slowly or fidgety/restless 3 3 -  Suicidal thoughts 0 0 -  PHQ-9 Score 20 24 -  Difficult doing work/chores Very difficult - -  PHQ-9 scores have slightly decreased. Patient did not start Prozac until  Yesterday, 04/13/16 due to insurance hold up. Patient is taking Klonopin however believes she needs a higher dose, states it is not working. Says PCP had her on a higher dose in the past.  Identified goals: Locating a long term therapist to assist with childhood trauma exacerbated by recent relationship breakup and learn to control anger. Issues discussed: identify support system, relaxed breathing, review of self care action plan; Automatic Negative Thoughts (ANTS),   Behavioral Health Intervention:  Referral to Counselor/Psychotherapist; psycho-education, Cognitive Behavioral therapy, Solution Focus, Relaxed breathing  PLAN: 1. F/U with LCSW: in one week 2. Patient is in agreement to implement interventions reviewed today: call list of therapist provided; Read Think & Feelings, changing your thinking; identify items of list of of "10 little things to do"; Go to Y with mother. 3. Behavioral Health meds: continue taking prozac and klonopin as prescribed by PCP 4. From scale of 1-10, how likely are you to follow plan:7 or 8 5. LCSW will provide PCP with an update.  Sammuel Hineseborah  Junah Yam, LCSW Licensed Clinical Social Worker [facility]   [phone] 12:49 PM

## 2016-04-15 ENCOUNTER — Telehealth: Payer: Self-pay | Admitting: Family Medicine

## 2016-04-15 MED ORDER — CLONAZEPAM 1 MG PO TABS: 1.0000 mg | ORAL_TABLET | Freq: Two times a day (BID) | ORAL | 0 refills | Status: DC | PRN

## 2016-04-15 NOTE — Telephone Encounter (Signed)
Discussed with LCSW Moore  Will increase clonazepam  Red Team Please call in the clonazepam RX Let patient know   Thanks

## 2016-04-15 NOTE — Telephone Encounter (Signed)
Rx called into patient pharmacy, left message on voicemail informing patient. 

## 2016-04-21 ENCOUNTER — Ambulatory Visit: Payer: Medicaid Other | Admitting: Family Medicine

## 2016-04-26 ENCOUNTER — Telehealth: Payer: Self-pay | Admitting: Licensed Clinical Social Worker

## 2016-04-26 NOTE — Progress Notes (Addendum)
Patient did not come in for her follow-up appointment 04/21/16 with PCP.    Follow up call to patient.  Patient was at work and unable to talk, stated she will call LCSW back.  Plan: LCSW will wait for patient to return call or schedule follow up visit.   Sammuel Hines, LCSW Licensed Clinical Social Worker [facility]   [phone] 10:04 AM

## 2016-05-31 ENCOUNTER — Telehealth: Payer: Self-pay | Admitting: *Deleted

## 2016-05-31 NOTE — Telephone Encounter (Signed)
Prior Authorization received from [facility] for Jardiance 10 mg. PA is pending per [place] [employer]. Clovis Pu, RN

## 2016-05-31 NOTE — Telephone Encounter (Signed)
PA was approved via [place] [employer] until 05/31/17.  Clovis Pu, RN

## 2016-06-09 ENCOUNTER — Ambulatory Visit (INDEPENDENT_AMBULATORY_CARE_PROVIDER_SITE_OTHER): Payer: Medicaid Other | Admitting: Family Medicine

## 2016-06-09 ENCOUNTER — Encounter: Payer: Self-pay | Admitting: Family Medicine

## 2016-06-09 VITALS — BP 100/60 | HR 84 | Temp 98.6°F | Ht 64.0 in | Wt 164.0 lb

## 2016-06-09 DIAGNOSIS — E118 Type 2 diabetes mellitus with unspecified complications: Secondary | ICD-10-CM

## 2016-06-09 DIAGNOSIS — R42 Dizziness and giddiness: Secondary | ICD-10-CM | POA: Diagnosis not present

## 2016-06-09 DIAGNOSIS — E1165 Type 2 diabetes mellitus with hyperglycemia: Secondary | ICD-10-CM

## 2016-06-09 DIAGNOSIS — F4323 Adjustment disorder with mixed anxiety and depressed mood: Secondary | ICD-10-CM | POA: Diagnosis not present

## 2016-06-09 LAB — POCT GLYCOSYLATED HEMOGLOBIN (HGB A1C): Hemoglobin A1C: 7

## 2016-06-09 NOTE — Patient Instructions (Addendum)
Good to see you today!  Thanks for coming in.  Your diabetes is doing great  Call Debra to schedule an appointment  Clonazepam - one tab twice a day for one week then one tab as needed the idea is to be off in the next 2-3 weeks  Drink 4 glasses of water every day.     The lightheadness should improve slowly over the next week - if worsening call or come back  I will call you if your lab tests are not normal.  Otherwise we will discuss them at your next visit.   Come back in 6 weeks for a PAP smear

## 2016-06-09 NOTE — Progress Notes (Signed)
Subjective  Patient is presenting with the following illnesses  DIABETES Disease Monitoring: Blood Sugar ranges(Severity) -not checking  Associated Symptoms- Polyuria/phagia/dipsia- no      Visual problems- no Medications: Compliance(Modifying factor) - daily  Hypoglycemic symptoms- no Timing - continuous  LIGHTHEADEDNESS For last few days when stands feel lightheadness and visual blurring that resolves in moments.  No syncope no focal weakness.  No nausea and vomiting or diarrhea.  Has been losing weight by not eating much (10 lbs over several months)  Does not happen when sitting or just moving .  Admits to not drinking a lot  Anxiety Feels is some better.  Has not followed up with IC but plans to.  Taking prozac and clonopin regularly.  Feels her stressors are somewhat improved    Chief Complaint noted Review of Symptoms - see HPI PMH - Smoking status noted.     Objective Vital Signs reviewed Psych:  Cognition and judgment appear intact. Alert, communicative  and cooperative with normal attention span and concentration. No apparent delusions, illusions, hallucinations Heart - Regular rate and rhythm.  No murmurs, gallops or rubs.    Lungs:  Normal respiratory effort, chest expands symmetrically. Lungs are clear to auscultation, no crackles or wheezes. Extremities:  No cyanosis, edema, or deformity noted with good range of motion of all major joints.   Able to get up and down from exam table with very mild lightheadness  Can walk on heels and shins normally     Assessments/Plans  No problem-specific Assessment & Plan notes found for this encounter.   See Encounter view if individual problem A/Ps not visible See after visit summary for details of patient instuctions

## 2016-06-09 NOTE — Assessment & Plan Note (Signed)
Unsure of cause.  See after visit summary.  Will check labs.  Close follow up

## 2016-06-09 NOTE — Assessment & Plan Note (Signed)
Improved.  Recommend she follow up with IC.  Seen after visit summary for medication wean

## 2016-06-09 NOTE — Assessment & Plan Note (Signed)
Improved.  Continue current medications and weight control

## 2016-06-10 ENCOUNTER — Encounter: Payer: Self-pay | Admitting: Family Medicine

## 2016-06-10 LAB — BASIC METABOLIC PANEL
BUN/Creatinine Ratio: 13 (ref 9–23)
BUN: 10 mg/dL (ref 6–20)
CALCIUM: 8.8 mg/dL (ref 8.7–10.2)
CHLORIDE: 101 mmol/L (ref 96–106)
CO2: 24 mmol/L (ref 18–29)
Creatinine, Ser: 0.8 mg/dL (ref 0.57–1.00)
GFR, EST AFRICAN AMERICAN: 109 mL/min/{1.73_m2} (ref >59)
GFR, EST NON AFRICAN AMERICAN: 94 mL/min/{1.73_m2} (ref >59)
Glucose: 151 mg/dL — ABNORMAL HIGH (ref 65–99)
Potassium: 4.2 mmol/L (ref 3.5–5.2)
Sodium: 139 mmol/L (ref 134–144)

## 2016-06-10 LAB — CBC
HEMATOCRIT: 38.4 % (ref 34.0–46.6)
HEMOGLOBIN: 12.3 g/dL (ref 11.1–15.9)
MCH: 24 pg — AB (ref 26.6–33.0)
MCHC: 32 g/dL (ref 31.5–35.7)
MCV: 75 fL — AB (ref 79–97)
Platelets: 201 10*3/uL (ref 150–379)
RBC: 5.13 x10E6/uL (ref 3.77–5.28)
RDW: 15.6 % — AB (ref 12.3–15.4)
WBC: 6.4 10*3/uL (ref 3.4–10.8)

## 2016-06-14 ENCOUNTER — Telehealth: Payer: Self-pay | Admitting: *Deleted

## 2016-06-14 NOTE — Telephone Encounter (Signed)
Prior Authorization received from [facility] for Januvia 100 mg. Should patient be on both Jardiance and Januvia.  PA was approved for Jardiance last month. Clovis PuMartin, Audrianna Driskill L, RN

## 2016-06-15 NOTE — Telephone Encounter (Signed)
PA is pending for Januvia per [place] [employer].  Clovis PuMartin, Tamika L, RN

## 2016-06-15 NOTE — Telephone Encounter (Signed)
Yes she should be on both.  Thanks  LC

## 2016-06-16 NOTE — Telephone Encounter (Signed)
Received PA approval for Januvia 100 mg via [place] [employer].  Med approved for 06/16/16 - 06/16/17.  [facility] informed.  PA confirmation number [id number] Link SnufferW . Martin, Bronson Ingamika L, RN

## 2016-07-15 ENCOUNTER — Encounter: Payer: Self-pay | Admitting: Internal Medicine

## 2016-07-15 ENCOUNTER — Ambulatory Visit (INDEPENDENT_AMBULATORY_CARE_PROVIDER_SITE_OTHER): Payer: Medicaid Other | Admitting: Internal Medicine

## 2016-07-15 VITALS — BP 98/66 | HR 87 | Temp 98.0°F | Wt 159.0 lb

## 2016-07-15 DIAGNOSIS — E1165 Type 2 diabetes mellitus with hyperglycemia: Secondary | ICD-10-CM | POA: Diagnosis not present

## 2016-07-15 DIAGNOSIS — F329 Major depressive disorder, single episode, unspecified: Secondary | ICD-10-CM

## 2016-07-15 DIAGNOSIS — F32A Depression, unspecified: Secondary | ICD-10-CM

## 2016-07-15 DIAGNOSIS — R5383 Other fatigue: Secondary | ICD-10-CM | POA: Diagnosis present

## 2016-07-15 DIAGNOSIS — E118 Type 2 diabetes mellitus with unspecified complications: Secondary | ICD-10-CM

## 2016-07-15 LAB — GLUCOSE, POCT (MANUAL RESULT ENTRY): POC GLUCOSE: 147 mg/dL — AB (ref 70–99)

## 2016-07-15 MED ORDER — GLUCOSE BLOOD VI STRP: ORAL_STRIP | 12 refills | Status: AC

## 2016-07-15 MED ORDER — FLUOXETINE HCL 20 MG PO TABS: 40.0000 mg | ORAL_TABLET | Freq: Every day | ORAL | 1 refills | Status: DC

## 2016-07-15 MED ORDER — ACCU-CHEK AVIVA PLUS W/DEVICE KIT: 1.0000 [IU] | PACK | Freq: Every day | 0 refills | Status: AC

## 2016-07-15 MED ORDER — ACCU-CHEK MULTICLIX LANCET DEV KIT: 1.0000 [IU] | PACK | Freq: Every day | 0 refills | Status: AC

## 2016-07-15 MED ORDER — ACCU-CHEK SOFTCLIX LANCET DEV MISC: 5 refills | Status: AC

## 2016-07-15 NOTE — Patient Instructions (Signed)
[name],   I will call you with your thyroid results.  I have increased your prozac to 40 mg.  [facility] will give you a call to check in.  Best, Dr. Sampson GoonFitzgerald

## 2016-07-15 NOTE — Assessment & Plan Note (Signed)
-   Suspect 2/2 mood. Patient with PHQ-9 in severe range for depression. - Will check TSH, but was normal in 2016. - Recently had CBC that did not show anemia (though MCW slightly low at 75 so could be somewhat iron deficient) - Was not hypoglycemic on POC glucose testing at 147. Does not have means of checking blood sugar, so ordered glucometer and testing supplies.

## 2016-07-15 NOTE — Assessment & Plan Note (Addendum)
-   PHQ-9 score of 24, which represents severe depression. Discussed options with patient and decided to increase prozac to 40 mg daily. She had just picked up a refill so will take 2 pills daily. - [facility] available today. Left message with [facility] to touch base with patient by phone. - Counseled on precautions for stopping prozac (increased suicidal ideation, worsening mood)

## 2016-07-15 NOTE — Progress Notes (Signed)
[facility] Progress Note  Subjective:  Sara Brock is a 36 y.o. female with history of T2DM and depression who presents for increased fatigue and depressed mood.  #Increased fatigue - Has noticed over the last 2 months - Reports associated decreased appetite, though patient says this is normal for her when she has increased stress. - Has been waking up a lot at night - Also reports being "extra cold" and having chills - Is concerned she could be having low blood sugar but does not have meter at home to check - Did feel jittery this morning; this improved after eating breakfast ROS: Positive for weight loss and constipation; negative for dry skin, hair loss  #Depression: - Started on prozac in March - Has not noticed much of a difference - Denies any trouble taking the medication regularly, says she takes the prozac with her diabetes medication - Notes stress at work and at home but did not want to discuss further, as she did not want to start crying; does feel safe - Patient stopped taking prn klonopin because it made her too groggy ROS: No SI/HI  No Known Allergies  Objective: Blood pressure 98/66, pulse 87, temperature 98 F (36.7 C), temperature source Oral, weight 159 lb (72.1 kg), last menstrual period 07/08/2016, SpO2 99 %. Body mass index is 27.29 kg/m. Constitutional: Overweight female in NAD HENT: MMM Neck: Thyroid symmetric, not enlarged or tender Cardiovascular: RRR, S1, S2, no m/r/g.  Psychiatric: Occasionally tearful.   Vitals reviewed  Depression screen [facility] 2/9 07/15/2016 06/09/2016 04/14/2016 03/31/2016 12/23/2015  Decreased Interest 3 0 3 3 0  Down, Depressed, Hopeless 3 1 2 3  0  PHQ - 2 Score 6 1 5 6  0  Altered sleeping 3 0 3 3 -  Tired, decreased energy 3 0 3 3 -  Change in appetite 3 0 3 3 -  Feeling bad or failure about yourself  3 0 1 3 -  Trouble concentrating 3 0 2 3 -  Moving slowly or fidgety/restless 3 - 3 3 -  Suicidal  thoughts 0 0 0 0 -  PHQ-9 Score 24 1 20 24  -  Difficult doing work/chores - - Very difficult - -  Some recent data might be hidden   Recent Results (from the past 2160 hour(s))  HgB A1c     Status: None   Collection Time: 06/09/16 10:57 AM  Result Value Ref Range   Hemoglobin A1C 7.0   Basic Metabolic Panel     Status: Abnormal   Collection Time: 06/09/16 11:30 AM  Result Value Ref Range   Glucose 151 (H) 65 - 99 mg/dL   BUN 10 6 - 20 mg/dL   Creatinine, Ser 9.60 0.57 - 1.00 mg/dL   GFR calc non Af Amer 94 >59 mL/min/1.73   GFR calc Af Amer 109 >59 mL/min/1.73   BUN/Creatinine Ratio 13 9 - 23   Sodium 139 134 - 144 mmol/L   Potassium 4.2 3.5 - 5.2 mmol/L   Chloride 101 96 - 106 mmol/L   CO2 24 18 - 29 mmol/L   Calcium 8.8 8.7 - 10.2 mg/dL  CBC     Status: Abnormal   Collection Time: 06/09/16 11:30 AM  Result Value Ref Range   WBC 6.4 3.4 - 10.8 x10E3/uL   RBC 5.13 3.77 - 5.28 x10E6/uL   Hemoglobin 12.3 11.1 - 15.9 g/dL   Hematocrit 45.4 09.8 - 46.6 %   MCV 75 (L) 79 - 97 fL  MCH 24.0 (L) 26.6 - 33.0 pg   MCHC 32.0 31.5 - 35.7 g/dL   RDW 16.115.6 (H) 09.612.3 - 04.515.4 %   Platelets 201 150 - 379 x10E3/uL  Glucose (CBG)     Status: Abnormal   Collection Time: 07/15/16  3:19 PM  Result Value Ref Range   POC Glucose 147 (A) 70 - 99 mg/dl   Assessment/Plan: Other fatigue - Suspect 2/2 mood. Patient with PHQ-9 in severe range for depression. - Will check TSH, but was normal in 2016. - Recently had CBC that did not show anemia (though MCW slightly low at 75 so could be somewhat iron deficient) - Was not hypoglycemic on POC glucose testing at 147. Does not have means of checking blood sugar, so ordered glucometer and testing supplies.    Depression - PHQ-9 score of 24, which represents severe depression. Discussed options with patient and decided to increase prozac to 40 mg daily. She had just picked up a refill so will take 2 pills daily. - [facility] but no  provider available today. Left message with [facility] to touch base with patient by phone. - Counseled on precautions for stopping prozac (increased suicidal ideation, worsening mood)  Follow-up with PCP as planned next week.  Sara GobbleHillary Basil Buffin, MD [facility], PGY-2

## 2016-07-16 LAB — TSH: TSH: 0.328 u[IU]/mL — AB (ref 0.450–4.500)

## 2016-07-19 ENCOUNTER — Other Ambulatory Visit: Payer: Self-pay | Admitting: *Deleted

## 2016-07-19 MED ORDER — FLUOXETINE HCL 40 MG PO CAPS: 40.0000 mg | ORAL_CAPSULE | Freq: Every day | ORAL | 1 refills | Status: DC

## 2016-07-19 NOTE — Progress Notes (Unsigned)
Prior Author[facility] received [facility] for fluoxetine 20 mg tabs. Formulary preferred by medicaid fluoxetine capsules.  Rx changed to Fluoxetine 40 mg capsule once daily.  Clovis PuMartin, Michaela Broski L, RN

## 2016-07-21 ENCOUNTER — Ambulatory Visit: Payer: Medicaid Other | Admitting: Family Medicine

## 2016-07-23 LAB — T4, FREE: Free T4: 1.31 ng/dL (ref 0.82–1.77)

## 2016-07-23 LAB — T3: T3 TOTAL: 121 ng/dL (ref 71–180)

## 2016-07-23 LAB — SPECIMEN STATUS REPORT

## 2016-10-09 ENCOUNTER — Other Ambulatory Visit: Payer: Self-pay | Admitting: Internal Medicine

## 2016-12-22 ENCOUNTER — Ambulatory Visit: Payer: Self-pay | Admitting: Family Medicine

## 2016-12-22 ENCOUNTER — Other Ambulatory Visit (HOSPITAL_COMMUNITY)
Admission: RE | Admit: 2016-12-22 | Discharge: 2016-12-22 | Disposition: A | Discharge status: Home / Self Care | Payer: Medicaid Other | Source: Ambulatory Visit | Attending: Family Medicine | Admitting: Family Medicine

## 2016-12-22 ENCOUNTER — Encounter: Payer: Self-pay | Admitting: Family Medicine

## 2016-12-22 ENCOUNTER — Telehealth: Payer: Self-pay | Admitting: Family Medicine

## 2016-12-22 ENCOUNTER — Other Ambulatory Visit: Payer: Self-pay

## 2016-12-22 VITALS — BP 92/68 | HR 87 | Temp 98.7°F | Ht 64.0 in | Wt 157.4 lb

## 2016-12-22 DIAGNOSIS — Z124 Encounter for screening for malignant neoplasm of cervix: Secondary | ICD-10-CM | POA: Diagnosis not present

## 2016-12-22 DIAGNOSIS — E11649 Type 2 diabetes mellitus with hypoglycemia without coma: Secondary | ICD-10-CM

## 2016-12-22 DIAGNOSIS — E118 Type 2 diabetes mellitus with unspecified complications: Secondary | ICD-10-CM

## 2016-12-22 DIAGNOSIS — N898 Other specified noninflammatory disorders of vagina: Secondary | ICD-10-CM | POA: Insufficient documentation

## 2016-12-22 DIAGNOSIS — F3289 Other specified depressive episodes: Secondary | ICD-10-CM

## 2016-12-22 DIAGNOSIS — Z23 Encounter for immunization: Secondary | ICD-10-CM

## 2016-12-22 LAB — POCT WET PREP (WET MOUNT)
CLUE CELLS WET PREP WHIFF POC: POSITIVE
Trichomonas Wet Prep HPF POC: ABSENT

## 2016-12-22 LAB — POCT GLYCOSYLATED HEMOGLOBIN (HGB A1C): HEMOGLOBIN A1C: 6.3

## 2016-12-22 MED ORDER — METRONIDAZOLE 500 MG PO TABS: 500.0000 mg | ORAL_TABLET | Freq: Three times a day (TID) | ORAL | 0 refills | Status: DC

## 2016-12-22 NOTE — Progress Notes (Signed)
Subjective  Patient is presenting with the following illnesses  DIABETES Disease Monitoring: Blood Sugar ranges(Severity) -not checking  Associated Symptoms- Polyuria/phagia/dipsia- no      Visual problems- no Medications: Compliance(Modifying factor) - daily each med Hypoglycemic symptoms- no Timing - continuous  DEPRESSION Some stress but is working and taking care of things well.  No suicidal ideation.   Does not do much for fun.  Sleeping - "OK"  Would like to see a counselor wants to set up on her own  VAGINAL DISCHARGE  Having vaginal discharge for 5-10 days. Medications tried: none Discharge consistency: white thick  Discharge color: whitish Recent antibiotic use: no Sex in last month: no Possible STD exposure:no  Symptoms Fever: no Dysuria:no Vaginal bleeding: no Abdomen or Pelvic pain: no Back pain: no Genital sores or ulcers:no Rash: no Pain during sex: no Missed menstrual period: no  ROS see HPI Smoking Status noted   Monitoring Labs and Parameters Last A1C:  Lab Results  Component Value Date   HGBA1C 6.3 12/22/2016   Last Lipid:     Component Value Date/Time   CHOL 150 05/11/2013 1437   HDL 22 (L) 05/11/2013 1437   LDLDIRECT 127 06/18/2015 0909   Last Bmet  Potassium  Date Value Ref Range Status  06/09/2016 4.2 3.5 - 5.2 mmol/L Final   Sodium  Date Value Ref Range Status  06/09/2016 139 134 - 144 mmol/L Final   Creat  Date Value Ref Range Status  04/11/2015 0.71 0.50 - 1.10 mg/dL Final   Creatinine, Ser  Date Value Ref Range Status  06/09/2016 0.80 0.57 - 1.00 mg/dL Final            Chief Complaint noted Review of Symptoms - see HPI PMH - Smoking status noted.     Objective Vital Signs reviewed Genitalia:  Normal introitus for age, no external lesions, scant white vaginal discharge, mucosa pink and moist, no vaginal or cervical lesions, no vaginal atrophy, no friaility or hemorrhage,   Assessments/Plans  Vaginal  discharge Consistent with BV.  Will treat with flagyl and recommend no douching   DM (diabetes mellitus), type 2, uncontrolled (HCC) Well controlled continue current medications.  Discussed decreasing but she would like to continue at current doses    See after visit summary for details of patient instuctions

## 2016-12-22 NOTE — Assessment & Plan Note (Signed)
Stable.  See after visit summary for plan

## 2016-12-22 NOTE — Assessment & Plan Note (Addendum)
Consistent with BV.  Will treat with flagyl and recommend no douching. Spoke with her via phone about this plan

## 2016-12-22 NOTE — Assessment & Plan Note (Signed)
Well controlled continue current medications.  Discussed decreasing but she would like to continue at current doses

## 2016-12-22 NOTE — Patient Instructions (Addendum)
Great to see you  Your diabetes is doing great  I would make an appointment with a counselor to discuss your mood.  If it is worsening please let me know  Try to do something fun - even if its something little every day  Come back in 3 months for a checkup  I will call you with the results

## 2016-12-26 NOTE — Telephone Encounter (Signed)
error 

## 2016-12-28 LAB — CYTOLOGY - PAP
DIAGNOSIS: NEGATIVE
HPV: NOT DETECTED

## 2017-01-18 ENCOUNTER — Other Ambulatory Visit: Payer: Self-pay | Admitting: Family Medicine

## 2017-01-18 MED ORDER — SITAGLIPTIN PHOSPHATE 100 MG PO TABS: ORAL_TABLET | ORAL | 1 refills | Status: DC

## 2017-01-19 ENCOUNTER — Telehealth: Payer: Self-pay | Admitting: *Deleted

## 2017-01-19 NOTE — Telephone Encounter (Signed)
Pt is having issue with her insurance at the moment and cant afford her [place].  Wants to know if she can still take the juardiance.    Spoke with Dr. Deirdre Priesthambliss, since her a1c had improved at last visit, he wants her to continue juardiance and be sure to follow up in 3 months for an a1c check.  Pt informed. Fleeger, Maryjo RochesterJessica Dawn, CMA

## 2017-04-28 ENCOUNTER — Telehealth: Payer: Self-pay | Admitting: Family Medicine

## 2017-04-28 NOTE — Telephone Encounter (Signed)
Patient had her eyes examined at [facility] last year and said she is due for another appt.  She would like for you to discuss options for providers and referrals for feet examination.

## 2017-06-22 ENCOUNTER — Other Ambulatory Visit: Payer: Self-pay

## 2017-06-22 ENCOUNTER — Emergency Department (HOSPITAL_COMMUNITY): Payer: Medicaid Other

## 2017-06-22 ENCOUNTER — Emergency Department (HOSPITAL_COMMUNITY)
Admission: EM | Admit: 2017-06-22 | Discharge: 2017-06-22 | Disposition: A | Discharge status: Home / Self Care | Payer: Medicaid Other | Attending: Emergency Medicine | Admitting: Emergency Medicine

## 2017-06-22 ENCOUNTER — Encounter (HOSPITAL_COMMUNITY): Payer: Self-pay

## 2017-06-22 DIAGNOSIS — R05 Cough: Secondary | ICD-10-CM | POA: Insufficient documentation

## 2017-06-22 DIAGNOSIS — R079 Chest pain, unspecified: Secondary | ICD-10-CM

## 2017-06-22 DIAGNOSIS — R0789 Other chest pain: Secondary | ICD-10-CM | POA: Insufficient documentation

## 2017-06-22 DIAGNOSIS — Z7984 Long term (current) use of oral hypoglycemic drugs: Secondary | ICD-10-CM | POA: Insufficient documentation

## 2017-06-22 DIAGNOSIS — Z79899 Other long term (current) drug therapy: Secondary | ICD-10-CM | POA: Insufficient documentation

## 2017-06-22 DIAGNOSIS — R202 Paresthesia of skin: Secondary | ICD-10-CM | POA: Insufficient documentation

## 2017-06-22 DIAGNOSIS — E119 Type 2 diabetes mellitus without complications: Secondary | ICD-10-CM | POA: Insufficient documentation

## 2017-06-22 LAB — CBC
HEMATOCRIT: 42 % (ref 36.0–46.0)
HEMOGLOBIN: 13.2 g/dL (ref 12.0–15.0)
MCH: 23.7 pg — AB (ref 26.0–34.0)
MCHC: 31.4 g/dL (ref 30.0–36.0)
MCV: 75.4 fL — AB (ref 78.0–100.0)
Platelets: 207 10*3/uL (ref 150–400)
RBC: 5.57 MIL/uL — AB (ref 3.87–5.11)
RDW: 13.8 % (ref 11.5–15.5)
WBC: 5.3 10*3/uL (ref 4.0–10.5)

## 2017-06-22 LAB — BASIC METABOLIC PANEL
ANION GAP: 11 (ref 5–15)
BUN: 7 mg/dL (ref 6–20)
CHLORIDE: 101 mmol/L (ref 101–111)
CO2: 24 mmol/L (ref 22–32)
Calcium: 8.9 mg/dL (ref 8.9–10.3)
Creatinine, Ser: 0.81 mg/dL (ref 0.44–1.00)
GFR calc non Af Amer: >60 mL/min (ref >60)
GLUCOSE: 316 mg/dL — AB (ref 65–99)
POTASSIUM: 3.8 mmol/L (ref 3.5–5.1)
Sodium: 136 mmol/L (ref 135–145)

## 2017-06-22 LAB — I-STAT BETA HCG BLOOD, ED (MC, WL, AP ONLY): I-stat hCG, quantitative: <5 m[IU]/mL (ref <5)

## 2017-06-22 LAB — I-STAT TROPONIN, ED: TROPONIN I, POC: 0 ng/mL (ref 0.00–0.08)

## 2017-06-22 NOTE — ED Triage Notes (Addendum)
Pt endorses left sided chest pain with left arm tingling and neck pain x 5 days constant. VSS.Pt admits to extra stress and anxiety over that 5 days and has hx of anxiety. Appears anxious in triage.

## 2017-06-22 NOTE — Discharge Instructions (Addendum)
Please read attached information. If you experience any new or worsening signs or symptoms please return to the emergency room for evaluation. Please follow-up with your primary care provider or specialist as discussed.  °

## 2017-06-22 NOTE — ED Provider Notes (Signed)
[place] EMERGENCY DEPARTMENT Provider Note   CSN: [id number] Arrival date & time: 06/22/17  1112     History   Chief Complaint Chief Complaint  Patient presents with  . Chest Pain    HPI Sara Brock is a 37 y.o. female.  HPI   37 year old female presents today with complaints of left-sided chest tightness.  Patient notes this is been going on for 1 week, she describes this as tightness, she denies pain.  She denies any worsening of symptoms with deep inspiration or shortness of breath.  She notes 2 days ago she developed a dry nonproductive cough without fever.  She notes she has tingling in her left arm occasionally.  She initially denied any history of chest pain in the past, after chart review it was noted that she had seen a cardiologist in the past, she notes that this was chest pain secondary to stress which she feels could likely be the culprit today.  She notes she has been under significant stress.  She reports in 2016 she had a stress test with no acute abnormalities.  She reports history diabetes and hyperlipidemia, she denies any significant personal or family cardiac history.  She is a non-smoker.  She denies any history DVT or PE, or any significant risk factors.  She notes that going to sleep and resting make her tightness better.       Past Medical History:  Diagnosis Date  . Abscess   . Anxiety   . Diabetes mellitus   . Seasonal allergies     Patient Active Problem List   Diagnosis Date Noted  . Lightheaded 06/09/2016  . Exposure to STD 03/31/2016  . Marijuana abuse 04/11/2015  . Other fatigue 03/29/2014  . Adjustment disorder with mixed anxiety and depressed mood 03/29/2014  . Vaginal discharge 09/08/2013  . DM (diabetes mellitus), type 2, uncontrolled ([place]) 10/14/2011  . Depression 10/26/2007    Past Surgical History:  Procedure Laterality Date  . TUBAL LIGATION       OB History   None      Home Medications      Prior to Admission medications   Medication Sig Start Date End Date Taking? Authorizing Provider  atorvastatin (LIPITOR) 40 MG tablet Take 40 mg by mouth daily.    [provider]  Blood Glucose Monitoring Suppl (ACCU-CHEK AVIVA PLUS) w/Device KIT 1 Units by Does not apply route daily. 07/15/16   Rogue Bussing, MD  FLUoxetine (PROZAC) 40 MG capsule TAKE 1 CAPSULE BY MOUTH EVERY DAY 10/11/16   Lind Covert, MD  glucose blood (ACCU-CHEK AVIVA) test strip Use as instructed 07/15/16   Rogue Bussing, MD  JARDIANCE 10 MG TABS tablet Take 10 mg by mouth daily. 12/23/15   McKeag, Marylynn Pearson, MD  Lancet Devices (ACCU-CHEK [facility]) lancets Use as instructed 07/15/16   Rogue Bussing, MD  Lancets Misc. (ACCU-CHEK MULTICLIX LANCET DEV) KIT 1 Units by Does not apply route daily. 07/15/16   Rogue Bussing, MD  metroNIDAZOLE (FLAGYL) 500 MG tablet Take 1 tablet (500 mg total) by mouth 3 (three) times daily. 12/22/16   Chambliss, Jeb Levering, MD  sitaGLIPtin (JANUVIA) 100 MG tablet TAKE 1 TABLET (100 MG TOTAL) BY MOUTH DAILY. 01/18/17   Lind Covert, MD    Family History Family History  Problem Relation Age of Onset  . Hypertension Mother   . Hyperlipidemia Mother   . Diabetes Father   . Diabetes Brother   .  Diabetes Unknown        1ST DEGREE RELATIVE    Social History Social History   Tobacco Use  . Smoking status: Never Smoker  . Smokeless tobacco: Never Used  Substance Use Topics  . Alcohol use: No  . Drug use: Yes    Types: Marijuana    Comment: occasional marijuana use     Allergies   Patient has no known allergies.   Review of Systems Review of Systems  All other systems reviewed and are negative.    Physical Exam Updated Vital Signs BP 118/81   Pulse 85   Temp 98.9 F (37.2 C)   Resp 17   Ht '5\' 4"'$  (1.626 m)   Wt 77.1 kg (170 lb)   LMP 06/08/2017   SpO2 98%   BMI 29.18 kg/m   Physical Exam   Constitutional: She is oriented to person, place, and time. She appears well-developed and well-nourished.  HENT:  Head: Normocephalic and atraumatic.  Eyes: Pupils are equal, round, and reactive to light. Conjunctivae are normal. Right eye exhibits no discharge. Left eye exhibits no discharge. No scleral icterus.  Neck: Normal range of motion. No JVD present. No tracheal deviation present.  Cardiovascular: Normal rate, regular rhythm, normal heart sounds and intact distal pulses. Exam reveals no gallop and no friction rub.  No murmur heard. Pulmonary/Chest: Effort normal and breath sounds normal. No stridor. No respiratory distress. She has no wheezes. She has no rales. She exhibits no tenderness.  Musculoskeletal: She exhibits no edema.  Neurological: She is alert and oriented to person, place, and time. Coordination normal.  Psychiatric: She has a normal mood and affect. Her behavior is normal. Judgment and thought content normal.  Nursing note and vitals reviewed.   ED Treatments / Results  Labs (all labs ordered are listed, but only abnormal results are displayed) Labs Reviewed  BASIC METABOLIC PANEL - Abnormal; Notable for the following components:      Result Value   Glucose, Bld 316 (*)    All other components within normal limits  CBC - Abnormal; Notable for the following components:   RBC 5.57 (*)    MCV 75.4 (*)    MCH 23.7 (*)    All other components within normal limits  I-STAT TROPONIN, ED  I-STAT BETA HCG BLOOD, ED ([facility], [facility], [facility] ONLY)    EKG EKG Interpretation  Date/Time:  Wednesday Jun 22 2017 11:20:38 EDT Ventricular Rate:  92 PR Interval:  140 QRS Duration: 82 QT Interval:  346 QTC Calculation: 427 R Axis:   97 Text Interpretation:  Normal sinus rhythm Rightward axis Borderline ECG No significant change since last tracing Confirmed by Wandra Arthurs [id number]) on 06/22/2017 2:44:29 PM   Radiology Dg Chest 2 View  Result Date: 06/22/2017 CLINICAL DATA:  Chest  pain. EXAM: CHEST - 2 VIEW COMPARISON:  Radiographs of March 26, 2014. FINDINGS: The heart size and mediastinal contours are within normal limits. Both lungs are clear. No pneumothorax or pleural effusion is noted. The visualized skeletal structures are unremarkable. IMPRESSION: No active cardiopulmonary disease. Electronically Signed   By: Marijo Conception, M.D.   On: 06/22/2017 12:28    Procedures Procedures (including critical care time)  Medications Ordered in ED Medications - No data to display   Initial Impression / Assessment and Plan / ED Course  I have reviewed the triage vital signs and the nursing notes.  Pertinent labs & imaging results that were available during my care of  the patient were reviewed by me and considered in my medical decision making (see chart for details).    37 year old female presents today with complaints of chest tightness.  This is likely stress related, low suspicion for ACS dissection PE or any other life-threatening etiology.  Patient has reassuring work-up here with reassuring EKG, negative troponin.  She will be discharged home with outpatient follow-up.  Patient did have elevated blood sugar here.  She is encouraged to follow-up with primary care for ongoing evaluation and management of this.  Patient given strict return precautions, she verbalized understanding and agreement to today's plan had no further questions or concerns. Final Clinical Impressions(s) / ED Diagnoses   Final diagnoses:  Chest pain, unspecified type    ED Discharge Orders    None       Francee Gentile 06/23/17 1422    Drenda Freeze, MD 06/23/17 2206

## 2017-06-22 NOTE — ED Notes (Signed)
Pt given crackers and peanut butter.

## 2017-07-18 ENCOUNTER — Telehealth: Payer: Self-pay | Admitting: Family Medicine

## 2017-07-19 ENCOUNTER — Ambulatory Visit: Payer: Medicaid Other | Admitting: Family Medicine

## 2017-07-19 NOTE — Telephone Encounter (Signed)
Spoke with her.  Made an appointment for 6-18

## 2017-09-04 ENCOUNTER — Emergency Department (HOSPITAL_COMMUNITY): Payer: Self-pay

## 2017-09-04 ENCOUNTER — Encounter (HOSPITAL_COMMUNITY): Payer: Self-pay | Admitting: Emergency Medicine

## 2017-09-04 ENCOUNTER — Emergency Department (HOSPITAL_COMMUNITY)
Admission: EM | Admit: 2017-09-04 | Discharge: 2017-09-04 | Disposition: A | Discharge status: Home / Self Care | Payer: Self-pay | Attending: Emergency Medicine | Admitting: Emergency Medicine

## 2017-09-04 DIAGNOSIS — Z79899 Other long term (current) drug therapy: Secondary | ICD-10-CM | POA: Insufficient documentation

## 2017-09-04 DIAGNOSIS — F4323 Adjustment disorder with mixed anxiety and depressed mood: Secondary | ICD-10-CM | POA: Insufficient documentation

## 2017-09-04 DIAGNOSIS — Z7984 Long term (current) use of oral hypoglycemic drugs: Secondary | ICD-10-CM | POA: Insufficient documentation

## 2017-09-04 DIAGNOSIS — F1210 Cannabis abuse, uncomplicated: Secondary | ICD-10-CM | POA: Insufficient documentation

## 2017-09-04 DIAGNOSIS — F419 Anxiety disorder, unspecified: Secondary | ICD-10-CM | POA: Insufficient documentation

## 2017-09-04 DIAGNOSIS — X58XXXA Exposure to other specified factors, initial encounter: Secondary | ICD-10-CM | POA: Insufficient documentation

## 2017-09-04 DIAGNOSIS — Y9389 Activity, other specified: Secondary | ICD-10-CM | POA: Insufficient documentation

## 2017-09-04 DIAGNOSIS — Y929 Unspecified place or not applicable: Secondary | ICD-10-CM | POA: Insufficient documentation

## 2017-09-04 DIAGNOSIS — S93601A Unspecified sprain of right foot, initial encounter: Secondary | ICD-10-CM | POA: Insufficient documentation

## 2017-09-04 DIAGNOSIS — E119 Type 2 diabetes mellitus without complications: Secondary | ICD-10-CM | POA: Insufficient documentation

## 2017-09-04 DIAGNOSIS — Y998 Other external cause status: Secondary | ICD-10-CM | POA: Insufficient documentation

## 2017-09-04 MED ORDER — ACETAMINOPHEN 500 MG PO TABS: 500.0000 mg | ORAL_TABLET | Freq: Four times a day (QID) | ORAL | 0 refills | Status: DC | PRN

## 2017-09-04 NOTE — ED Provider Notes (Signed)
[facility] Provider Note   CSN: [id number] Arrival date & time: 09/04/17  1539     History   Chief Complaint Chief Complaint  Patient presents with  . Foot Pain    HPI Sara Brock is a 37 y.o. female who presents to the ED with foot pain. The pain is located in the right foot. Patient reports that the pain started after she wore high heel shoes to a funeral 5 days ago and has continued. She states that initially the pain was in both feet but after a couple days the pain in the left foot resolved.  Patient describes the pain as throbbing and states that it radiates up to the calf. The pain increases with weight bearing and ambulating. Patient has used warm salt water soaks, heating pad and icy hot without relief.   HPI  Past Medical History:  Diagnosis Date  . Abscess   . Anxiety   . Diabetes mellitus   . Seasonal allergies     Patient Active Problem List   Diagnosis Date Noted  . Lightheaded 06/09/2016  . Exposure to STD 03/31/2016  . Marijuana abuse 04/11/2015  . Other fatigue 03/29/2014  . Adjustment disorder with mixed anxiety and depressed mood 03/29/2014  . Vaginal discharge 09/08/2013  . DM (diabetes mellitus), type 2, uncontrolled ([place]) 10/14/2011  . Depression 10/26/2007    Past Surgical History:  Procedure Laterality Date  . TUBAL LIGATION       OB History   None      Home Medications    Prior to Admission medications   Medication Sig Start Date End Date Taking? Authorizing Provider  acetaminophen (TYLENOL) 500 MG tablet Take 1 tablet (500 mg total) by mouth every 6 (six) hours as needed. 09/04/17   Ashley Murrain, NP  atorvastatin (LIPITOR) 40 MG tablet Take 40 mg by mouth daily.    [provider]  Blood Glucose Monitoring Suppl (ACCU-CHEK AVIVA PLUS) w/Device KIT 1 Units by Does not apply route daily. 07/15/16   Rogue Bussing, MD  FLUoxetine (PROZAC) 40 MG capsule TAKE 1 CAPSULE BY  MOUTH EVERY DAY 10/11/16   Lind Covert, MD  glucose blood (ACCU-CHEK AVIVA) test strip Use as instructed 07/15/16   Rogue Bussing, MD  JARDIANCE 10 MG TABS tablet Take 10 mg by mouth daily. 12/23/15   McKeag, Marylynn Pearson, MD  Lancet Devices (ACCU-CHEK [facility]) lancets Use as instructed 07/15/16   Rogue Bussing, MD  Lancets Misc. (ACCU-CHEK MULTICLIX LANCET DEV) KIT 1 Units by Does not apply route daily. 07/15/16   Rogue Bussing, MD  metroNIDAZOLE (FLAGYL) 500 MG tablet Take 1 tablet (500 mg total) by mouth 3 (three) times daily. 12/22/16   Chambliss, Jeb Levering, MD  sitaGLIPtin (JANUVIA) 100 MG tablet TAKE 1 TABLET (100 MG TOTAL) BY MOUTH DAILY. 01/18/17   Lind Covert, MD    Family History Family History  Problem Relation Age of Onset  . Hypertension Mother   . Hyperlipidemia Mother   . Diabetes Father   . Diabetes Brother   . Diabetes Unknown        1ST DEGREE RELATIVE    Social History Social History   Tobacco Use  . Smoking status: Never Smoker  . Smokeless tobacco: Never Used  Substance Use Topics  . Alcohol use: No  . Drug use: Yes    Types: Marijuana    Comment: occasional marijuana use  Allergies   Patient has no known allergies.   Review of Systems Review of Systems  Musculoskeletal: Positive for arthralgias.       Right foot pain  All other systems reviewed and are negative.    Physical Exam Updated Vital Signs BP 115/88 (BP Location: Right Arm)   Pulse 86   Temp 98.5 F (36.9 C) (Oral)   Resp 16   Ht 5' 4" (1.626 m)   Wt 81.6 kg (180 lb)   LMP 09/03/2017   SpO2 100%   BMI 30.90 kg/m   Physical Exam  Constitutional: She appears well-developed and well-nourished. No distress.  Eyes: EOM are normal.  Neck: Neck supple.  Cardiovascular: Normal rate.  Pulmonary/Chest: Effort normal.  Musculoskeletal: Normal range of motion.       Right foot: There is tenderness. There is normal range of motion,  normal capillary refill, no deformity and no laceration.       Feet:  Pain with palpation to the lateral aspect of the right foot. Pedal pulse 2+, adequate circulation.  Neurological: She is alert.  Skin: Skin is warm and dry.  Psychiatric: She has a normal mood and affect.  Nursing note and vitals reviewed.    ED Treatments / Results  Labs (all labs ordered are listed, but only abnormal results are displayed) Labs Reviewed - No data to display  Radiology Dg Foot Complete Right  Result Date: 09/04/2017 CLINICAL DATA:  Increasing right foot pain since wearing heels 5 days ago. EXAM: RIGHT FOOT COMPLETE - 3+ VIEW COMPARISON:  None. FINDINGS: The mineralization and alignment are normal. There is no evidence of acute fracture or dislocation. The joint spaces are maintained. Tiny posterior calcaneal spur. No focal soft tissue abnormalities. IMPRESSION: Negative. Electronically Signed   By: William  Veazey M.D.   On: 09/04/2017 16:13    Procedures Procedures (including critical care time)  Medications Ordered in ED Medications - No data to display   Initial Impression / Assessment and Plan / ED Course  I have reviewed the triage vital signs and the nursing notes. 36 y.o. female here with right foot pain x 5 days after wearing high heel shoes stable for d/c without fracture or dislocation noted on x-ray. Ace wrap, ice, pain management and f/u with PCP. Patient agrees with plan.  Final Clinical Impressions(s) / ED Diagnoses   Final diagnoses:  Sprain of right foot, initial encounter    ED Discharge Orders        Ordered    acetaminophen (TYLENOL) 500 MG tablet  Every 6 hours PRN     09/04/17 1629       Neese, Hope M, NP 09/04/17 1634    Rees, Elizabeth, MD 09/05/17 0655  

## 2017-09-04 NOTE — Discharge Instructions (Addendum)
Follow up with your doctor if symptoms persist. Return here as needed. °

## 2017-09-04 NOTE — ED Notes (Signed)
Ace wrap and post op shoe were placed. Pt wanted around ankle to be loose, but secure around top of foot. PMS all present.

## 2017-09-04 NOTE — ED Triage Notes (Signed)
Pt presents with R foot pain after wearing heels to family members funeral on Tuesday; pt states pain is throbbing and radiates up to the calf; pt reports pain worse with weight bearing, movement to L and R; pt reports trying warm salt soaks, heating pad, icy hot, without improvement

## 2017-09-13 ENCOUNTER — Ambulatory Visit (INDEPENDENT_AMBULATORY_CARE_PROVIDER_SITE_OTHER): Payer: Self-pay | Admitting: Family Medicine

## 2017-09-13 VITALS — BP 120/80 | HR 76 | Temp 98.3°F | Wt 182.4 lb

## 2017-09-13 DIAGNOSIS — M7989 Other specified soft tissue disorders: Secondary | ICD-10-CM

## 2017-09-13 NOTE — Progress Notes (Signed)
   Subjective   Patient ID: Sara Brock    DOB: 1980-08-27, 37 y.o. female   MRN: [id number]  CC: "My right leg was swelling"  HPI: Sara Brock is a 37 y.o. female who presents to clinic today for the following:  LEG SWELLING  Having leg swelling for 14 days. Location: right leg from dorsal foot to posterior calf Timing of swelling: gradual onset New medications: none History of Kidney problems: no History of Heart problems: no History of Liver problems: no History of cancer: no  Symptoms Chest pain: no Shortness of breath: no Immobility: no Black or bloody bowel movements: no Severe snoring or day time sleepiness: no Abdomen swelling: no History of blood clots: no Weight Loss: no  Of note, patient is a non-smoker. No family history of blood clots. She denies hemoptysis.  She has taken Januvia over the last 2 days which completely resolved her swelling.  This is been difficult for her to receive given her lack of insurance.  She is currently awaiting medication assistance program and has her paperwork today.  ROS: see HPI for pertinent.  PMFSH: DM, h/o STD, THC use, depression. Smoking status reviewed. Medications reviewed.  Objective   BP 120/80 (BP Location: Right Arm, Patient Position: Sitting, Cuff Size: Normal)   Pulse 76   Temp 98.3 F (36.8 C) (Oral)   Wt 182 lb 6.4 oz (82.7 kg)   LMP 09/03/2017   SpO2 100%   BMI 31.31 kg/m  Vitals and nursing note reviewed.  General: well nourished, well developed, NAD with non-toxic appearance HEENT: normocephalic, atraumatic, moist mucous membranes Cardiovascular: regular rate and rhythm without murmurs, rubs, or gallops Lungs: clear to auscultation bilaterally with normal work of breathing Skin: warm, dry, no rashes or lesions, cap refill < 2 seconds Extremities: warm and well perfused, normal tone, no edema, symmetrical calves without erythema or palpable cords  Assessment & Plan   Right leg  swelling Acute.  Now resolved.  Likely related to hyperglycemic hyperosmolar state been quick response with use of Januvia.  Primary barrier is patient's inability to afford medication.  Currently working towards medication assistance program.  Potential for leg swelling include DVT which is unlikely given well score 0.  Not on estrogen therapy in non-smoker. - Advised patient to return to clinic if symptoms recur - Encouraged patient to proceed with MAP so that patient can receive Januvia  No orders of the defined types were placed in this encounter.  No orders of the defined types were placed in this encounter.   Durward Parcelavid McMullen, DO [facility], PGY-3 09/13/2017, 1:30 PM

## 2017-09-13 NOTE — Assessment & Plan Note (Addendum)
Acute.  Now resolved.  Likely related to hyperglycemic hyperosmolar state been quick response with use of Januvia.  Primary barrier is patient's inability to afford medication.  Currently working towards medication assistance program.  Potential for leg swelling include DVT which is unlikely given well score 0.  Not on estrogen therapy in non-smoker. - Advised patient to return to clinic if symptoms recur - Encouraged patient to proceed with MAP so that patient can receive Januvia

## 2017-09-13 NOTE — Patient Instructions (Signed)
Thank you for coming in to see us today. Please see below to review our plan for today's visit.  Continue taking the remainder of the Januvia samples.  We will leave the paperwork for the medication assistance program [place] in [place] will get in touch with you regarding the next step.  If your swelling returns please schedule an appointment immediately.  If you develop chest pain or shortness of breath, or blood with coughing, call [phone].  Please call the clinic at [phone] if your symptoms worsen or you have any concerns. It was our pleasure to serve you.  Durward Parcelavid McMullen, DO [facility], PGY-3

## 2017-09-28 ENCOUNTER — Ambulatory Visit: Payer: Self-pay

## 2017-12-13 ENCOUNTER — Encounter: Payer: Self-pay | Admitting: Family Medicine

## 2019-08-20 ENCOUNTER — Ambulatory Visit: Payer: Self-pay

## 2020-11-25 ENCOUNTER — Other Ambulatory Visit: Payer: Self-pay

## 2020-11-25 ENCOUNTER — Emergency Department (HOSPITAL_BASED_OUTPATIENT_CLINIC_OR_DEPARTMENT_OTHER)
Admission: EM | Admit: 2020-11-25 | Discharge: 2020-11-25 | Disposition: A | Discharge status: Home / Self Care | Payer: BLUE CROSS/BLUE SHIELD | Attending: Emergency Medicine | Admitting: Emergency Medicine

## 2020-11-25 DIAGNOSIS — Y92410 Unspecified street and highway as the place of occurrence of the external cause: Secondary | ICD-10-CM | POA: Insufficient documentation

## 2020-11-25 DIAGNOSIS — M25569 Pain in unspecified knee: Secondary | ICD-10-CM | POA: Insufficient documentation

## 2020-11-25 DIAGNOSIS — T148XXA Other injury of unspecified body region, initial encounter: Secondary | ICD-10-CM

## 2020-11-25 DIAGNOSIS — S46911A Strain of unspecified muscle, fascia and tendon at shoulder and upper arm level, right arm, initial encounter: Secondary | ICD-10-CM | POA: Diagnosis not present

## 2020-11-25 DIAGNOSIS — S4991XA Unspecified injury of right shoulder and upper arm, initial encounter: Secondary | ICD-10-CM | POA: Diagnosis present

## 2020-11-25 DIAGNOSIS — E119 Type 2 diabetes mellitus without complications: Secondary | ICD-10-CM | POA: Insufficient documentation

## 2020-11-25 MED ORDER — METHOCARBAMOL 500 MG PO TABS: 500.0000 mg | ORAL_TABLET | Freq: Two times a day (BID) | ORAL | 0 refills | Status: DC | PRN

## 2020-11-25 MED ORDER — NAPROXEN 500 MG PO TABS: 500.0000 mg | ORAL_TABLET | Freq: Two times a day (BID) | ORAL | 0 refills | Status: DC

## 2020-11-25 MED ORDER — IBUPROFEN 800 MG PO TABS
800.0000 mg | ORAL_TABLET | Freq: Once | ORAL | Status: AC
  Administered 2020-11-25: 800 mg via ORAL
  Filled 2020-11-25: qty 1

## 2020-11-25 NOTE — Discharge Instructions (Signed)
Please take Naprosyn, 500mg by mouth twice daily as needed for pain - this in an antiinflammatory medicine (NSAID) and is similar to ibuprofen - many people feel that it is stronger than ibuprofen and it is easier to take since it is a smaller pill.  Please use this only for 1 week - if your pain persists, you will need to follow up with your doctor in the office for ongoing guidance and pain control.    Please take Robaxin, 500 mg up to twice a day as needed for muscle spasm, this is a muscle relaxer, it may cause generalized weakness, sleepiness and you should not drive or do important things while taking this medication.  Thank you for letting us take care of you today!  Please obtain all of your results from medical records or have your doctors office obtain the results - share them with your doctor - you should be seen at your doctors office in the next 2 days. Call today to arrange your follow up. Take the medications as prescribed. Please review all of the medicines and only take them if you do not have an allergy to them. Please be aware that if you are taking birth control pills, taking other prescriptions, ESPECIALLY ANTIBIOTICS may make the birth control ineffective - if this is the case, either do not engage in sexual activity or use alternative methods of birth control such as condoms until you have finished the medicine and your family doctor says it is OK to restart them. If you are on a blood thinner such as COUMADIN, be aware that any other medicine that you take may cause the coumadin to either work too much, or not enough - you should have your coumadin level rechecked in next 7 days if this is the case.  ?  It is also a possibility that you have an allergic reaction to any of the medicines that you have been prescribed - Everybody reacts differently to medications and while MOST people have no trouble with most medicines, you may have a reaction such as nausea, vomiting, rash, swelling,  shortness of breath. If this is the case, please stop taking the medicine immediately and contact your physician.   If you were given a medication in the ED such as percocet, vicodin, or morphine, be aware that these medicines are sedating and may change your ability to take care of yourself adequately for several hours after being given this medicines - you should not drive or take care of small children if you were given this medicine in the Emergency Department or if you have been prescribed these types of medicines. ?   You should return to the ER IMMEDIATELY if you develop severe or worsening symptoms.   

## 2020-11-25 NOTE — ED Notes (Signed)
Discharge instructions discussed with pt. Pt verbalized understanding. Pt stable and ambulatory.  °
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
## 2021-02-03 ENCOUNTER — Ambulatory Visit (HOSPITAL_COMMUNITY)
Admit: 2021-02-03 | Discharge status: Against Medical Advice | Payer: BLUE CROSS/BLUE SHIELD | Attending: Emergency Medicine | Admitting: Emergency Medicine

## 2021-10-02 ENCOUNTER — Encounter (HOSPITAL_COMMUNITY): Payer: Self-pay

## 2021-10-02 ENCOUNTER — Ambulatory Visit (HOSPITAL_COMMUNITY)
Admission: RE | Admit: 2021-10-02 | Discharge: 2021-10-02 | Disposition: A | Discharge status: Home / Self Care | Payer: BLUE CROSS/BLUE SHIELD | Source: Ambulatory Visit | Attending: Urgent Care | Admitting: Urgent Care

## 2021-10-02 VITALS — BP 108/72 | Temp 98.7°F | Resp 18

## 2021-10-02 DIAGNOSIS — L723 Sebaceous cyst: Secondary | ICD-10-CM | POA: Diagnosis present

## 2021-10-02 DIAGNOSIS — L089 Local infection of the skin and subcutaneous tissue, unspecified: Secondary | ICD-10-CM | POA: Diagnosis present

## 2021-10-02 DIAGNOSIS — E11628 Type 2 diabetes mellitus with other skin complications: Secondary | ICD-10-CM

## 2021-10-02 MED ORDER — CEPHALEXIN 500 MG PO CAPS: 500.0000 mg | ORAL_CAPSULE | Freq: Four times a day (QID) | ORAL | 0 refills | Status: AC

## 2021-10-02 MED ORDER — MUPIROCIN 2 % EX OINT: 1.0000 | TOPICAL_OINTMENT | Freq: Three times a day (TID) | CUTANEOUS | 0 refills | Status: DC

## 2021-10-02 MED ORDER — SULFAMETHOXAZOLE-TRIMETHOPRIM 800-160 MG PO TABS: 1.0000 | ORAL_TABLET | Freq: Two times a day (BID) | ORAL | 0 refills | Status: AC

## 2021-10-02 NOTE — Discharge Instructions (Signed)
You have an infected sebaceous cyst. Please use a warm moist washcloth and apply it to the area several times daily.  This will help remove any excess drainage. After each warm compress, please apply the topical mupirocin 3 times daily. Take Keflex 4 times daily and Bactrim twice daily until gone. Please follow-up with the dermatology should you choose to remove your sebaceous cyst.  Cysts can become infected or grow.

## 2021-10-02 NOTE — ED Provider Notes (Signed)
[facility]    CSN: [id number] Arrival date & time: 10/02/21  1822      History   Chief Complaint Chief Complaint  Patient presents with   Abscess    HPI RHAYA COALE is a 41 y.o. female.   Pleasant 41 year old female presents today due to concerns of a bump to her right upper thigh.  She reports 1 month ago she noted a painless bump to the right medial superior thigh.  Over the past several days however, it has grown in size, and become increasingly painful.  She noted just this morning it started draining.  She has not tried any treatments to the area, neither orally or topically.  She denies any systemic symptoms including fever or chills.  She denies any symptoms in the groin. Pt does have hx of DM.   Abscess   Past Medical History:  Diagnosis Date   Abscess    Anxiety    Diabetes mellitus    Seasonal allergies     Patient Active Problem List   Diagnosis Date Noted   Right leg swelling 09/13/2017   Lightheaded 06/09/2016   Exposure to STD 03/31/2016   Marijuana abuse 04/11/2015   Other fatigue 03/29/2014   Adjustment disorder with mixed anxiety and depressed mood 03/29/2014   Vaginal discharge 09/08/2013   DM (diabetes mellitus), type 2, uncontrolled 10/14/2011   Depression 10/26/2007    Past Surgical History:  Procedure Laterality Date   TUBAL LIGATION      OB History   No obstetric history on file.      Home Medications    Prior to Admission medications   Medication Sig Start Date End Date Taking? Authorizing Provider  acetaminophen (TYLENOL) 500 MG tablet Take 1 tablet (500 mg total) by mouth every 6 (six) hours as needed. 09/04/17  Yes Neese, [place], NP  atorvastatin (LIPITOR) 40 MG tablet Take 40 mg by mouth daily.   Yes [provider]  Blood Glucose Monitoring Suppl (ACCU-CHEK AVIVA PLUS) w/Device KIT 1 Units by Does not apply route daily. 07/15/16  Yes Rogue Bussing, MD  cephALEXin (KEFLEX) 500 MG capsule  Take 1 capsule (500 mg total) by mouth 4 (four) times daily for 5 days. 10/02/21 10/07/21 Yes Jalena Vanderlinden L, PA  glucose blood (ACCU-CHEK AVIVA) test strip Use as instructed 07/15/16  Yes Rogue Bussing, MD  Lancet Devices (ACCU-CHEK [facility]) lancets Use as instructed 07/15/16  Yes Rogue Bussing, MD  Lancets Misc. (ACCU-CHEK MULTICLIX LANCET DEV) KIT 1 Units by Does not apply route daily. 07/15/16  Yes Rogue Bussing, MD  mupirocin ointment (BACTROBAN) 2 % Apply 1 Application topically 3 (three) times daily. 10/02/21  Yes Lucely Leard L, PA  SEMGLEE, YFGN, 100 UNIT/ML Pen SMARTSIG:28 Unit(s) SUB-Q Every Night 07/20/21  Yes [provider]  sulfamethoxazole-trimethoprim (BACTRIM DS) 800-160 MG tablet Take 1 tablet by mouth 2 (two) times daily for 5 days. 10/02/21 10/07/21 Yes Lylian Sanagustin, Sherren Kerns, PA    Family History Family History  Problem Relation Age of Onset   Hypertension Mother    Hyperlipidemia Mother    Diabetes Father    Diabetes Brother    Diabetes Unknown        1ST DEGREE RELATIVE    Social History Social History   Tobacco Use   Smoking status: Never   Smokeless tobacco: Never  Substance Use Topics   Alcohol use: No   Drug use: Yes    Types: Marijuana  Comment: occasional marijuana use     Allergies   Patient has no known allergies.   Review of Systems Review of Systems As per HPI  Physical Exam Triage Vital Signs ED Triage Vitals  Enc Vitals Group     BP 10/02/21 1835 108/72     Pulse --      Resp 10/02/21 1835 18     Temp 10/02/21 1835 98.7 F (37.1 C)     Temp Source 10/02/21 1835 Oral     SpO2 10/02/21 1835 98 %     Weight --      Height --      Head Circumference --      Peak Flow --      Pain Score 10/02/21 1833 7     Pain Loc --      Pain Edu? --      Excl. in [place]? --    No data found.  Updated Vital Signs BP 108/72 (BP Location: Right Arm)   Temp 98.7 F (37.1 C) (Oral)   Resp 18   LMP 09/18/2021  (Approximate)   SpO2 98%   Visual Acuity Right Eye Distance:   Left Eye Distance:   Bilateral Distance:    Right Eye Near:   Left Eye Near:    Bilateral Near:     Physical Exam Vitals and nursing note reviewed.  Constitutional:      General: She is not in acute distress.    Appearance: Normal appearance. She is well-developed. She is not ill-appearing or toxic-appearing.  HENT:     Head: Normocephalic and atraumatic.  Eyes:     Conjunctiva/sclera: Conjunctivae normal.  Cardiovascular:     Rate and Rhythm: Normal rate.     Heart sounds: No murmur heard. Pulmonary:     Effort: Pulmonary effort is normal. No respiratory distress.  Abdominal:     Palpations: Abdomen is soft.     Tenderness: There is no abdominal tenderness.  Musculoskeletal:        General: No swelling.  Skin:    General: Skin is warm and dry.     Capillary Refill: Capillary refill takes less than 2 seconds.     Findings: Lesion (single epidermoid cyst to superior R medial thigh with superficial infection, pinpoint area of purulent drainage) present. No erythema or rash.  Neurological:     Mental Status: She is alert.  Psychiatric:        Mood and Affect: Mood normal.      UC Treatments / Results  Labs (all labs ordered are listed, but only abnormal results are displayed) Labs Reviewed  AEROBIC/ANAEROBIC CULTURE W GRAM STAIN (SURGICAL/DEEP WOUND)    EKG   Radiology No results found.  Procedures Procedures (including critical care time)  Medications Ordered in UC Medications - No data to display  Initial Impression / Assessment and Plan / UC Course  I have reviewed the triage vital signs and the nursing notes.  Pertinent labs & imaging results that were available during my care of the patient were reviewed by me and considered in my medical decision making (see chart for details).     Infected sebaceous cyst -this appears clinically to be a sebaceous cyst with surrounding infection.  It  is already self lysed and draining on exam.  Wound culture obtained.  Patient does have diabetes, will cover for MRSA and MSSA.  Antibiotics x5 days, topical mupirocin after warm compresses.  Recommended follow-up with dermatology for possible removal of the  cyst once infection resolved.  Return to clinic precautions reviewed.   Final Clinical Impressions(s) / UC Diagnoses   Final diagnoses:  Infected sebaceous cyst     Discharge Instructions      You have an infected sebaceous cyst. Please use a warm moist washcloth and apply it to the area several times daily.  This will help remove any excess drainage. After each warm compress, please apply the topical mupirocin 3 times daily. Take Keflex 4 times daily and Bactrim twice daily until gone. Please follow-up with the dermatology should you choose to remove your sebaceous cyst.  Cysts can become infected or grow.     ED Prescriptions     Medication Sig Dispense Auth. Provider   sulfamethoxazole-trimethoprim (BACTRIM DS) 800-160 MG tablet Take 1 tablet by mouth 2 (two) times daily for 5 days. 10 tablet Mason Burleigh L, PA   cephALEXin (KEFLEX) 500 MG capsule Take 1 capsule (500 mg total) by mouth 4 (four) times daily for 5 days. 20 capsule Vinia Jemmott L, PA   mupirocin ointment (BACTROBAN) 2 % Apply 1 Application topically 3 (three) times daily. 22 g Belkis Norbeck L, [place]      PDMP not reviewed this encounter.   Chaney Malling, [place] 10/02/21 1900

## 2021-10-02 NOTE — ED Triage Notes (Signed)
Pt states she has a "spot" on the inside of right thigh X 4 weeks. It has started draining.

## 2021-10-05 LAB — AEROBIC CULTURE W GRAM STAIN (SUPERFICIAL SPECIMEN)

## 2022-01-27 ENCOUNTER — Emergency Department (HOSPITAL_BASED_OUTPATIENT_CLINIC_OR_DEPARTMENT_OTHER)
Admission: EM | Admit: 2022-01-27 | Discharge: 2022-01-28 | Disposition: A | Discharge status: Home / Self Care | Payer: BLUE CROSS/BLUE SHIELD | Attending: Emergency Medicine | Admitting: Emergency Medicine

## 2022-01-27 ENCOUNTER — Encounter (HOSPITAL_BASED_OUTPATIENT_CLINIC_OR_DEPARTMENT_OTHER): Payer: Self-pay | Admitting: Emergency Medicine

## 2022-01-27 ENCOUNTER — Other Ambulatory Visit: Payer: Self-pay

## 2022-01-27 DIAGNOSIS — B379 Candidiasis, unspecified: Secondary | ICD-10-CM

## 2022-01-27 DIAGNOSIS — E1165 Type 2 diabetes mellitus with hyperglycemia: Secondary | ICD-10-CM | POA: Insufficient documentation

## 2022-01-27 DIAGNOSIS — R739 Hyperglycemia, unspecified: Secondary | ICD-10-CM

## 2022-01-27 DIAGNOSIS — B3731 Acute candidiasis of vulva and vagina: Secondary | ICD-10-CM | POA: Insufficient documentation

## 2022-01-27 LAB — URINALYSIS, ROUTINE W REFLEX MICROSCOPIC
Bilirubin Urine: NEGATIVE
Glucose, UA: >=500 mg/dL — AB
Hgb urine dipstick: NEGATIVE
Ketones, ur: NEGATIVE mg/dL
Leukocytes,Ua: NEGATIVE
Nitrite: NEGATIVE
Protein, ur: NEGATIVE mg/dL
Specific Gravity, Urine: 1.025 (ref 1.005–1.030)
pH: 6 (ref 5.0–8.0)

## 2022-01-27 LAB — URINALYSIS, MICROSCOPIC (REFLEX): WBC, UA: NONE SEEN WBC/hpf (ref 0–5)

## 2022-01-27 LAB — PREGNANCY, URINE: Preg Test, Ur: NEGATIVE

## 2022-01-27 NOTE — ED Triage Notes (Signed)
Back pain, painful urination and vaginal discharge x 1 week. Pt called pcp last week and had medication for yeast infection called in with no improvement

## 2022-01-27 NOTE — ED Provider Notes (Signed)
Emergency Department Provider Note   I have reviewed the triage vital signs and the nursing notes.   HISTORY  Chief Complaint Urinary Frequency and Vaginal Discharge   HPI Sara KOCAK is a 41 y.o. female with PMH of IDDM and seasonal allergies presents to the emergency department for evaluation of discomfort with urination and vaginal discharge.  Symptoms been ongoing for the past week.  She is having some mild discomfort in her back as well.  She has had issues with yeast infection in the past secondary to poor blood sugar management.  She states she is back on her insulin and her PCP empirically started her on 2 doses of Diflucan but symptoms have not fully resolved.  She is not having vaginal bleeding.  No fevers or chills.  No anterior abdominal pain.   Past Medical History:  Diagnosis Date   Abscess    Anxiety    Diabetes mellitus    Seasonal allergies     Review of Systems  Constitutional: No fever/chills Eyes: No visual changes. ENT: No sore throat. Cardiovascular: Denies chest pain. Respiratory: Denies shortness of breath. Gastrointestinal: No abdominal pain.  No nausea, no vomiting.  No diarrhea.  No constipation. Genitourinary: Mild for dysuria. Positive vaginal discharge.  Musculoskeletal: Positive for back pain. Skin: Negative for rash. Neurological: Negative for headaches, focal weakness or numbness.  ____________________________________________   PHYSICAL EXAM:  VITAL SIGNS: ED Triage Vitals  Enc Vitals Group     BP 01/27/22 1913 130/78     Pulse Rate 01/27/22 1913 83     Resp 01/27/22 1913 20     Temp 01/27/22 1913 98 F (36.7 C)     Temp src --      SpO2 01/27/22 1913 98 %     Weight 01/27/22 1924 180 lb (81.6 kg)     Height 01/27/22 1924 5\' 4"  (1.626 m)   Constitutional: Alert and oriented. Well appearing and in no acute distress. Eyes: Conjunctivae are normal. Head: Atraumatic. Nose: No congestion/rhinnorhea. Mouth/Throat:  Mucous membranes are moist.  Neck: No stridor.  Cardiovascular: Normal rate, regular rhythm. Good peripheral circulation. Grossly normal heart sounds.   Respiratory: Normal respiratory effort.  No retractions. Lungs CTAB. Gastrointestinal: Soft and nontender. No distention.  Musculoskeletal: No lower extremity tenderness nor edema. No gross deformities of extremities. Neurologic:  Normal speech and language. No gross focal neurologic deficits are appreciated.  Skin:  Skin is warm, dry and intact. No rash noted.  ____________________________________________   LABS (all labs ordered are listed, but only abnormal results are displayed)  Labs Reviewed  WET PREP, GENITAL - Abnormal; Notable for the following components:      Result Value   Yeast Wet Prep HPF POC PRESENT (*)    All other components within normal limits  URINALYSIS, ROUTINE W REFLEX MICROSCOPIC - Abnormal; Notable for the following components:   Glucose, UA >=500 (*)    All other components within normal limits  URINALYSIS, MICROSCOPIC (REFLEX) - Abnormal; Notable for the following components:   Bacteria, UA RARE (*)    All other components within normal limits  BASIC METABOLIC PANEL - Abnormal; Notable for the following components:   Glucose, Bld 294 (*)    Calcium 8.5 (*)    All other components within normal limits  PREGNANCY, URINE  HIV ANTIBODY (ROUTINE TESTING W REFLEX)  GC/CHLAMYDIA PROBE AMP ([place]) NOT AT [facility]    ____________________________________________   PROCEDURES  Procedure(s) performed:   Procedures  None  ____________________________________________   INITIAL IMPRESSION / ASSESSMENT AND PLAN / ED COURSE  Pertinent labs & imaging results that were available during my care of the patient were reviewed by me and considered in my medical decision making (see chart for details).   This patient is Presenting for Evaluation of dysuria, which does require a range of treatment options, and  is a complaint that involves a high risk of morbidity and mortality.  The Differential Diagnoses include UTI, yeast infection, STI, PID, etc.    Clinical Laboratory Tests Ordered, included patient with mild hyperglycemia but normal CO2.  No anion gap to suggest DKA.  UA not consistent with acute urinary tract infection.  Have sent gonorrhea/chlamydia swabs.  Wet prep shows yeast but no other acute findings.  HIV testing non-reactive.  Medical Decision Making: Summary:  Patient presents emergency department with mild dysuria and discharge which seems most consistent with vaginal candidiasis.  Plan for longer course of Diflucan with close PCP and GYN follow-up.  Gonorrhea/chlamydia pending.  Less concern for PID clinically.  Patient with no recent exposure or concern for STI.  Will not treat empirically in this case. Patient to follow results in MyChart app.    Patient's presentation is most consistent with acute illness / injury with system symptoms.   Disposition: discharge  ____________________________________________  FINAL CLINICAL IMPRESSION(S) / ED DIAGNOSES  Final diagnoses:  Yeast infection  Hyperglycemia     NEW OUTPATIENT MEDICATIONS STARTED DURING THIS VISIT:  Discharge Medication List as of 01/28/2022 12:46 AM     START taking these medications   Details  fluconazole (DIFLUCAN) 150 MG tablet Take 1 tablet (150 mg total) by mouth daily for 5 days., Starting Thu 01/28/2022, Until Tue 02/02/2022, Normal        Note:  This document was prepared using Dragon voice recognition software and may include unintentional dictation errors.  Alona Bene, MD, [facility] Emergency Medicine    Jasmina Gendron, Arlyss Repress, MD 01/29/22 [phone]

## 2022-01-28 LAB — BASIC METABOLIC PANEL
Anion gap: 6 (ref 5–15)
BUN: 7 mg/dL (ref 6–20)
CO2: 29 mmol/L (ref 22–32)
Calcium: 8.5 mg/dL — ABNORMAL LOW (ref 8.9–10.3)
Chloride: 101 mmol/L (ref 98–111)
Creatinine, Ser: 0.67 mg/dL (ref 0.44–1.00)
GFR, Estimated: >60 mL/min (ref >60)
Glucose, Bld: 294 mg/dL — ABNORMAL HIGH (ref 70–99)
Potassium: 3.9 mmol/L (ref 3.5–5.1)
Sodium: 136 mmol/L (ref 135–145)

## 2022-01-28 LAB — WET PREP, GENITAL
Clue Cells Wet Prep HPF POC: NONE SEEN
Sperm: NONE SEEN
Trich, Wet Prep: NONE SEEN
WBC, Wet Prep HPF POC: <10 (ref <10)

## 2022-01-28 LAB — HIV ANTIBODY (ROUTINE TESTING W REFLEX): HIV Screen 4th Generation wRfx: NONREACTIVE

## 2022-01-28 MED ORDER — FLUCONAZOLE 150 MG PO TABS: 150.0000 mg | ORAL_TABLET | Freq: Every day | ORAL | 0 refills | Status: AC

## 2022-01-28 NOTE — Discharge Instructions (Signed)
You were seen in the emergency department today with vaginal discomfort.  You have a yeast infection and I am treating this.  It is likely continuing due to your elevated blood sugars.  Please continue to work with your primary care physician regarding treatment of this.  Return to the emergency department any new or suddenly worsening symptoms.

## 2022-01-29 LAB — GC/CHLAMYDIA PROBE AMP (~~LOC~~) NOT AT ARMC
Chlamydia: NEGATIVE
Comment: NEGATIVE
Comment: NORMAL
Neisseria Gonorrhea: NEGATIVE

## 2022-10-17 ENCOUNTER — Emergency Department (HOSPITAL_COMMUNITY)
Admission: EM | Admit: 2022-10-17 | Discharge: 2022-10-17 | Discharge status: Against Medical Advice | Payer: Medicaid Other | Attending: Emergency Medicine | Admitting: Emergency Medicine

## 2022-10-17 ENCOUNTER — Other Ambulatory Visit: Payer: Self-pay

## 2022-10-17 ENCOUNTER — Emergency Department (HOSPITAL_COMMUNITY): Payer: Medicaid Other

## 2022-10-17 DIAGNOSIS — W01198A Fall on same level from slipping, tripping and stumbling with subsequent striking against other object, initial encounter: Secondary | ICD-10-CM | POA: Insufficient documentation

## 2022-10-17 DIAGNOSIS — Z5321 Procedure and treatment not carried out due to patient leaving prior to being seen by health care provider: Secondary | ICD-10-CM | POA: Diagnosis not present

## 2022-10-17 DIAGNOSIS — M545 Low back pain, unspecified: Secondary | ICD-10-CM | POA: Diagnosis present

## 2022-10-17 NOTE — ED Triage Notes (Signed)
Patient reports low back pain injured yesterday afternoon when she fell and hit a wooden steps , denies LOC/ambulatory .

## 2022-10-17 NOTE — ED Notes (Signed)
Pt stated she was leaving and did not want to wait.

## 2023-03-16 DIAGNOSIS — Z7985 Long-term (current) use of injectable non-insulin antidiabetic drugs: Secondary | ICD-10-CM | POA: Diagnosis not present

## 2023-03-16 DIAGNOSIS — E782 Mixed hyperlipidemia: Secondary | ICD-10-CM | POA: Diagnosis not present

## 2023-03-16 DIAGNOSIS — N898 Other specified noninflammatory disorders of vagina: Secondary | ICD-10-CM | POA: Diagnosis not present

## 2023-03-16 DIAGNOSIS — Z79899 Other long term (current) drug therapy: Secondary | ICD-10-CM | POA: Diagnosis not present

## 2023-03-16 DIAGNOSIS — E1165 Type 2 diabetes mellitus with hyperglycemia: Secondary | ICD-10-CM | POA: Diagnosis not present

## 2023-07-21 ENCOUNTER — Ambulatory Visit (HOSPITAL_COMMUNITY)
Admission: EM | Admit: 2023-07-21 | Discharge: 2023-07-21 | Disposition: A | Discharge status: Home / Self Care | Attending: Family Medicine | Admitting: Family Medicine

## 2023-07-21 ENCOUNTER — Encounter (HOSPITAL_COMMUNITY): Payer: Self-pay

## 2023-07-21 DIAGNOSIS — B3731 Acute candidiasis of vulva and vagina: Secondary | ICD-10-CM | POA: Diagnosis not present

## 2023-07-21 DIAGNOSIS — N898 Other specified noninflammatory disorders of vagina: Secondary | ICD-10-CM | POA: Diagnosis not present

## 2023-07-21 LAB — POCT URINALYSIS DIP (MANUAL ENTRY)
Bilirubin, UA: NEGATIVE
Blood, UA: NEGATIVE
Glucose, UA: >=1000 mg/dL — AB
Leukocytes, UA: NEGATIVE
Nitrite, UA: NEGATIVE
Protein Ur, POC: NEGATIVE mg/dL
Spec Grav, UA: 1.02 (ref 1.010–1.025)
Urobilinogen, UA: 0.2 U/dL
pH, UA: 5.5 (ref 5.0–8.0)

## 2023-07-21 MED ORDER — FLUCONAZOLE 200 MG PO TABS: 200.0000 mg | ORAL_TABLET | Freq: Every day | ORAL | 0 refills | Status: AC

## 2023-07-21 NOTE — ED Triage Notes (Signed)
 Patient presenting with vaginal irritation onset 2 weeks ago. Patient states she has been out of her insulin  due to her insurance changing.   Prescriptions or OTC medications tried: No

## 2023-07-21 NOTE — ED Provider Notes (Signed)
 [facility]    CSN: [id number] Arrival date & time: 07/21/23  1005      History   Chief Complaint Chief Complaint  Patient presents with   Vaginal Itching    HPI Sara Brock is a 43 y.o. female.   The patient reports recently not being on her insulin  because of insurance problems.  Her blood sugar was running in the 400s but over the last several days she has had it down to the low 200s.  After coming off of her insulin  she noticed vaginal irritation without discharge, there has been no odor but some slight irritation externally with urination.  She denies any urgency or frequency, fevers, chills, flank pain, nausea, vomiting, diarrhea, constipation, or recent illness.  The history is provided by the patient.  Vaginal Itching Pertinent negatives include no chest pain and no abdominal pain.    Past Medical History:  Diagnosis Date   Abscess    Anxiety    Diabetes mellitus    Seasonal allergies     Patient Active Problem List   Diagnosis Date Noted   Right leg swelling 09/13/2017   Lightheaded 06/09/2016   Exposure to STD 03/31/2016   Marijuana abuse 04/11/2015   Other fatigue 03/29/2014   Adjustment disorder with mixed anxiety and depressed mood 03/29/2014   Vaginal discharge 09/08/2013   DM (diabetes mellitus), type 2, uncontrolled 10/14/2011   Depression 10/26/2007    Past Surgical History:  Procedure Laterality Date   TUBAL LIGATION      OB History   No obstetric history on file.      Home Medications    Prior to Admission medications   Medication Sig Start Date End Date Taking? Authorizing Provider  atorvastatin  (LIPITOR) 20 MG tablet Take 1 tablet by mouth daily. 04/09/21  Yes [provider]  Blood Glucose Monitoring Suppl (ACCU-CHEK AVIVA PLUS) w/Device KIT 1 Units by Does not apply route daily. 07/15/16  Yes Michae Aden, MD  fluconazole  (DIFLUCAN ) 200 MG tablet Take 1 tablet (200 mg total) by mouth daily for  7 days. 07/21/23 07/28/23 Yes Claybon Cuna, MD  glucose blood (ACCU-CHEK AVIVA) test strip Use as instructed 07/15/16  Yes Michae Aden, MD  Lancet Devices (ACCU-CHEK [facility]) lancets Use as instructed 07/15/16  Yes Michae Aden, MD  Lancets Misc. (ACCU-CHEK MULTICLIX LANCET DEV) KIT 1 Units by Does not apply route daily. 07/15/16  Yes Fitzgerald, Hillary Moen, MD  OZEMPIC, 0.25 OR 0.5 MG/DOSE, 2 MG/3ML SOPN Inject 0.75 mg into the skin once a week.   Yes [provider]  SEMGLEE, YFGN, 100 UNIT/ML Pen SMARTSIG:28 Unit(s) SUB-Q Every Night 07/20/21   [provider]  TOUJEO SOLOSTAR 300 UNIT/ML Solostar Pen Inject into the skin. 01/24/23   [provider]    Family History Family History  Problem Relation Age of Onset   Hypertension Mother    Hyperlipidemia Mother    Diabetes Father    Diabetes Brother    Diabetes Unknown        1ST DEGREE RELATIVE    Social History Social History   Tobacco Use   Smoking status: Never   Smokeless tobacco: Never  Substance Use Topics   Alcohol use: No   Drug use: Yes    Types: Marijuana    Comment: daily marijuana use     Allergies   Patient has no known allergies.   Review of Systems Review of Systems  Constitutional:  Negative for chills  and fever.  HENT:  Negative for congestion.   Respiratory:  Negative for cough.   Cardiovascular:  Negative for chest pain, palpitations and leg swelling.  Gastrointestinal:  Negative for abdominal pain, blood in stool, constipation, diarrhea, nausea and vomiting.  Endocrine: Negative for polydipsia and polyuria.  Genitourinary:  Positive for dysuria (mild). Negative for decreased urine volume, difficulty urinating, flank pain, frequency, genital sores, hematuria, urgency, vaginal bleeding, vaginal discharge and vaginal pain.  Skin:  Negative for color change, rash and wound.     Physical Exam Triage Vital Signs ED Triage Vitals  Encounter  Vitals Group     BP 07/21/23 1035 102/70     Girls Systolic BP Percentile --      Girls Diastolic BP Percentile --      Boys Systolic BP Percentile --      Boys Diastolic BP Percentile --      Pulse Rate 07/21/23 1035 79     Resp 07/21/23 1035 16     Temp 07/21/23 1035 98.3 F (36.8 C)     Temp Source 07/21/23 1035 Oral     SpO2 07/21/23 1035 98 %     Weight 07/21/23 1035 179 lb 14.3 oz (81.6 kg)     Height 07/21/23 1035 5' 4 (1.626 m)     Head Circumference --      Peak Flow --      Pain Score 07/21/23 1033 7     Pain Loc --      Pain Education --      Exclude from Growth Chart --    No data found.  Updated Vital Signs BP 102/70 (BP Location: Left Arm)   Pulse 79   Temp 98.3 F (36.8 C) (Oral)   Resp 16   Ht 5' 4 (1.626 m)   Wt 81.6 kg   LMP 07/03/2023 (Approximate)   SpO2 98%   BMI 30.88 kg/m   Visual Acuity Right Eye Distance:   Left Eye Distance:   Bilateral Distance:    Right Eye Near:   Left Eye Near:    Bilateral Near:     Physical Exam Vitals reviewed.  Constitutional:      General: She is not in acute distress.    Appearance: Normal appearance. She is normal weight. She is not ill-appearing, toxic-appearing or diaphoretic.   Eyes:     General: No scleral icterus.    Extraocular Movements: Extraocular movements intact.     Pupils: Pupils are equal, round, and reactive to light.   Pulmonary:     Effort: Pulmonary effort is normal.  Abdominal:     General: Abdomen is flat. There is no distension.     Palpations: Abdomen is soft.     Tenderness: There is no abdominal tenderness. There is no right CVA tenderness, left CVA tenderness, guarding or rebound.   Musculoskeletal:        General: No swelling.     Right lower leg: No edema.     Left lower leg: No edema.   Skin:    Capillary Refill: Capillary refill takes 2 to 3 seconds.     Coloration: Skin is not jaundiced.     Findings: No rash.   Neurological:     Mental Status: She is alert.       UC Treatments / Results  Labs (all labs ordered are listed, but only abnormal results are displayed) Labs Reviewed  POCT URINALYSIS DIP (MANUAL ENTRY) - Abnormal; Notable for the following  components:      Result Value   Glucose, UA >=1,000 (*)    Ketones, POC UA small (15) (*)    All other components within normal limits  CERVICOVAGINAL ANCILLARY ONLY    EKG   Radiology No results found.  Procedures Procedures (including critical care time)  Medications Ordered in UC Medications - No data to display  Initial Impression / Assessment and Plan / UC Course  I have reviewed the triage vital signs and the nursing notes.  Pertinent labs & imaging results that were available during my care of the patient were reviewed by me and considered in my medical decision making (see chart for details).     Vaginal irritation -Given the patient's external vaginal irritation without discharge, odor, fever, chills or other symptoms and her recent uncontrolled blood sugars differential includes vaginal candidiasis vs, bacterial vaginosis, and UTI. - UA with high glucose but noninfectious - I will send in treatment for vaginal candidiasis. -Vaginal swab pending.  Will call patient with results and any further needed treatment. - The patient voiced understanding and agreement with the plan.    Final Clinical Impressions(s) / UC Diagnoses   Final diagnoses:  Vaginal candidiasis     Discharge Instructions      Take the medication to completion even if you are symptom-free. It is very important that you follow-up with your PCP for diabetes management as high blood sugars can lead to recurrent infections and prolonged recovery time. We will call you with the results of your lab work and any further needed medications.     ED Prescriptions     Medication Sig Dispense Auth. Provider   fluconazole  (DIFLUCAN ) 200 MG tablet Take 1 tablet (200 mg total) by mouth daily for 7 days.  7 tablet Claybon Cuna, MD      PDMP not reviewed this encounter.   Claybon Cuna, MD 07/21/23 1200

## 2023-07-21 NOTE — Discharge Instructions (Addendum)
 Take the medication to completion even if you are symptom-free. It is very important that you follow-up with your PCP for diabetes management as high blood sugars can lead to recurrent infections and prolonged recovery time. We will call you with the results of your lab work and any further needed medications.

## 2023-07-22 ENCOUNTER — Ambulatory Visit (HOSPITAL_COMMUNITY): Payer: Self-pay

## 2023-07-22 LAB — CERVICOVAGINAL ANCILLARY ONLY
Bacterial Vaginitis (gardnerella): POSITIVE — AB
Candida Glabrata: NEGATIVE
Candida Vaginitis: NEGATIVE
Comment: NEGATIVE
Comment: NEGATIVE
Comment: NEGATIVE

## 2023-07-22 MED ORDER — METRONIDAZOLE 500 MG PO TABS: 500.0000 mg | ORAL_TABLET | Freq: Two times a day (BID) | ORAL | 0 refills | Status: AC

## 2023-08-04 ENCOUNTER — Encounter (HOSPITAL_COMMUNITY): Payer: Self-pay | Admitting: *Deleted

## 2023-08-04 ENCOUNTER — Ambulatory Visit (HOSPITAL_COMMUNITY)
Admission: EM | Admit: 2023-08-04 | Discharge: 2023-08-04 | Disposition: A | Discharge status: Home / Self Care | Attending: Family Medicine | Admitting: Family Medicine

## 2023-08-04 DIAGNOSIS — Z794 Long term (current) use of insulin: Secondary | ICD-10-CM

## 2023-08-04 DIAGNOSIS — E1165 Type 2 diabetes mellitus with hyperglycemia: Secondary | ICD-10-CM

## 2023-08-04 MED ORDER — TOUJEO SOLOSTAR 300 UNIT/ML ~~LOC~~ SOPN: 20.0000 [IU] | PEN_INJECTOR | Freq: Two times a day (BID) | SUBCUTANEOUS | 1 refills | Status: DC

## 2023-08-04 MED ORDER — INSULIN PEN NEEDLE 30G X 8 MM MISC: 1.0000 | 0 refills | Status: AC | PRN

## 2023-08-04 NOTE — ED Triage Notes (Signed)
 Pt states that there has been a mix up with her Toujeo and her insurance. She has been out of her Toujeo for 4 weeks. She states that she had lantus called in today but her pcp wants her to stay on Toujeo but forgot to send the rx today

## 2023-08-05 ENCOUNTER — Telehealth (HOSPITAL_COMMUNITY): Payer: Self-pay

## 2023-08-05 DIAGNOSIS — E1165 Type 2 diabetes mellitus with hyperglycemia: Secondary | ICD-10-CM

## 2023-08-05 MED ORDER — INSULIN GLARGINE 100 UNIT/ML SOLOSTAR PEN: 20.0000 [IU] | PEN_INJECTOR | Freq: Two times a day (BID) | SUBCUTANEOUS | 0 refills | Status: AC

## 2023-08-05 NOTE — Telephone Encounter (Signed)
 Sara Brock with [facility] pharmacy called and states that the Toujeo  is not covered but Lantus  is. Pharmacy wanted to know if Lantus  could be prescribed instead of Toujeo . Please advise.

## 2023-08-05 NOTE — Telephone Encounter (Signed)
 Patient came to urgent care today Patient verification complete (name and date of birth). The patient reports she was told by the pharmacy that the medication prescribed Toujeo  could not be ordered with just one pen and the order would have to be for the whole pack. Pharmacy called by nurse to verify, pharmacy states she would have to get an order for the whole pack (of 3 pens). Provider RONAL Belt NP made aware and given order to change amount of insulin  pens. Amount changed with the pharmacy on phone for one pack of the Toujeo  medication. Patient aware.

## 2023-08-05 NOTE — Telephone Encounter (Signed)
 Toujeo  not covered by insurance. Pharmacy recommends lantus . Rx sent.

## 2023-08-09 NOTE — ED Provider Notes (Signed)
 [facility]   [id number] 08/04/23 Arrival Time: 1938  ASSESSMENT & PLAN:  1. Controlled type 2 diabetes mellitus with hyperglycemia, with long-term current use of insulin  (HCC)    Meds refilled at pt request.  Meds ordered this encounter  Medications   TOUJEO  SOLOSTAR 300 UNIT/ML Solostar Pen    Sig: Inject 20 Units into the skin in the morning and at bedtime.    Dispense:  1.5 mL    Refill:  1   Insulin  Pen Needle (NOVOFINE) 30G X 8 MM MISC    Sig: Inject 10 each into the skin as needed.    Dispense:  100 each    Refill:  0      Follow-up Information     Schedule an appointment as soon as possible for a visit  with Jolee Madelin Patch, MD.   Specialty: [facility] Medicine Contact information: [address] [place] [address] [phone]                 Reviewed expectations re: course of current medical issues. Questions answered. Outlined signs and symptoms indicating need for more acute intervention. Understanding verbalized. After Visit Summary given.   SUBJECTIVE: History from: Patient. Sara Brock is a 43 y.o. female. Pt states that there has been a mix up with her Toujeo  and her insurance. She has been out of her Toujeo  for 4 weeks. She states that she had lantus  called in today but her pcp wants her to stay on Toujeo  but forgot to send the rx today    OBJECTIVE:  Vitals:   08/04/23 1950  BP: 112/74  Pulse: 91  Resp: 18  Temp: 98.1 F (36.7 C)  TempSrc: Oral  SpO2: 98%    General appearance: alert; no distress  Psychological: alert and cooperative; normal mood and affect  Labs:  Labs Reviewed - No data to display  Imaging: No results found.  No Known Allergies  Past Medical History:  Diagnosis Date   Abscess    Anxiety    Diabetes mellitus    Seasonal allergies    Social History   Socioeconomic History   Marital status: Single    Spouse name: Not on file   Number of children: 4    Years of education: Not on file   Highest education level: Not on file  Occupational History   Not on file  Tobacco Use   Smoking status: Never   Smokeless tobacco: Never  Vaping Use   Vaping status: Never Used  Substance and Sexual Activity   Alcohol use: No   Drug use: Yes    Types: Marijuana    Comment: daily marijuana use   Sexual activity: Not Currently  Other Topics Concern   Not on file  Social History Narrative   Not on file   Social Drivers of Health   Financial Resource Strain: Not on file  Food Insecurity: Low Risk  (03/16/2023)   Received from [facility]   Hunger Vital Sign    Within the past 12 months, you worried that your food would run out before you got money to buy more: Never true    Within the past 12 months, the food you bought just didn't last and you didn't have money to get more. : Never true  Transportation Needs: No Transportation Needs (03/16/2023)   Received from [facility]    In the past 12 months, has lack of reliable transportation kept you  from medical appointments, meetings, work or from getting things needed for daily living? : No  Physical Activity: Not on file  Stress: Not on file  Social Connections: Not on file  Intimate Partner Violence: Not on file   Family History  Problem Relation Age of Onset   Hypertension Mother    Hyperlipidemia Mother    Diabetes Father    Diabetes Brother    Diabetes Unknown        1ST DEGREE RELATIVE   Past Surgical History:  Procedure Laterality Date   TUBAL LIGATION       Rolinda Rogue, MD 08/09/23 1109

## 2023-10-11 ENCOUNTER — Other Ambulatory Visit: Payer: Self-pay | Admitting: Emergency Medicine

## 2023-12-19 DIAGNOSIS — D649 Anemia, unspecified: Secondary | ICD-10-CM | POA: Diagnosis not present

## 2023-12-19 DIAGNOSIS — M25562 Pain in left knee: Secondary | ICD-10-CM | POA: Diagnosis not present

## 2023-12-19 DIAGNOSIS — R5383 Other fatigue: Secondary | ICD-10-CM | POA: Diagnosis not present

## 2023-12-25 ENCOUNTER — Other Ambulatory Visit: Payer: Self-pay

## 2023-12-25 ENCOUNTER — Emergency Department (HOSPITAL_BASED_OUTPATIENT_CLINIC_OR_DEPARTMENT_OTHER)
Admission: EM | Admit: 2023-12-25 | Discharge: 2023-12-25 | Disposition: A | Discharge status: Home / Self Care | Attending: Emergency Medicine | Admitting: Emergency Medicine

## 2023-12-25 ENCOUNTER — Emergency Department (HOSPITAL_BASED_OUTPATIENT_CLINIC_OR_DEPARTMENT_OTHER)

## 2023-12-25 ENCOUNTER — Encounter (HOSPITAL_BASED_OUTPATIENT_CLINIC_OR_DEPARTMENT_OTHER): Payer: Self-pay

## 2023-12-25 DIAGNOSIS — M25561 Pain in right knee: Secondary | ICD-10-CM | POA: Diagnosis not present

## 2023-12-25 DIAGNOSIS — Z79899 Other long term (current) drug therapy: Secondary | ICD-10-CM | POA: Insufficient documentation

## 2023-12-25 DIAGNOSIS — E119 Type 2 diabetes mellitus without complications: Secondary | ICD-10-CM | POA: Insufficient documentation

## 2023-12-25 DIAGNOSIS — Z794 Long term (current) use of insulin: Secondary | ICD-10-CM | POA: Insufficient documentation

## 2023-12-25 LAB — CBC WITH DIFFERENTIAL/PLATELET
Abs Immature Granulocytes: 0.01 K/uL (ref 0.00–0.07)
Basophils Absolute: 0 K/uL (ref 0.0–0.1)
Basophils Relative: 1 %
Eosinophils Absolute: 0.2 K/uL (ref 0.0–0.5)
Eosinophils Relative: 3 %
HCT: 36.8 % (ref 36.0–46.0)
Hemoglobin: 11.9 g/dL — ABNORMAL LOW (ref 12.0–15.0)
Immature Granulocytes: 0 %
Lymphocytes Relative: 42 %
Lymphs Abs: 3.3 K/uL (ref 0.7–4.0)
MCH: 24.2 pg — ABNORMAL LOW (ref 26.0–34.0)
MCHC: 32.3 g/dL (ref 30.0–36.0)
MCV: 74.8 fL — ABNORMAL LOW (ref 80.0–100.0)
Monocytes Absolute: 0.5 K/uL (ref 0.1–1.0)
Monocytes Relative: 6 %
Neutro Abs: 3.8 K/uL (ref 1.7–7.7)
Neutrophils Relative %: 48 %
Platelets: 253 K/uL (ref 150–400)
RBC: 4.92 MIL/uL (ref 3.87–5.11)
RDW: 14.6 % (ref 11.5–15.5)
WBC: 7.8 K/uL (ref 4.0–10.5)
nRBC: 0 % (ref 0.0–0.2)

## 2023-12-25 LAB — BASIC METABOLIC PANEL WITH GFR
Anion gap: 12 (ref 5–15)
BUN: 8 mg/dL (ref 6–20)
CO2: 27 mmol/L (ref 22–32)
Calcium: 9.7 mg/dL (ref 8.9–10.3)
Chloride: 98 mmol/L (ref 98–111)
Creatinine, Ser: 0.81 mg/dL (ref 0.44–1.00)
GFR, Estimated: >60 mL/min (ref >60)
Glucose, Bld: 219 mg/dL — ABNORMAL HIGH (ref 70–99)
Potassium: 3.7 mmol/L (ref 3.5–5.1)
Sodium: 137 mmol/L (ref 135–145)

## 2023-12-25 LAB — HCG, SERUM, QUALITATIVE: Preg, Serum: NEGATIVE

## 2023-12-25 NOTE — ED Provider Notes (Signed)
 [facility] AT [facility] Provider Note   CSN: [id number] Arrival date & time: 12/25/23  1646     Patient presents with: Knee Pain   Sara Brock is a 42 y.o. female with past medical history of diabetes who presents emergency department for evaluation of right knee pain.  Patient states her right knee pain started approximately 1 week ago and has not improved.  She denies any inciting injury.  Patient was evaluated by her PCP on Friday and was advised she may need an x-ray for further evaluation.  Patient denies any systemic symptoms such as fever, chills, body aches.  She reports the pain is better when her leg is straight, but increases the pain when she has to bend her knee.  She is able to walk, but has been altering her gait secondary to the pain and reports she is limping.    Knee Pain      Prior to Admission medications   Medication Sig Start Date End Date Taking? Authorizing Provider  atorvastatin  (LIPITOR) 20 MG tablet Take 1 tablet by mouth daily. 04/09/21   [provider]  Blood Glucose Monitoring Suppl (ACCU-CHEK AVIVA PLUS) w/Device KIT 1 Units by Does not apply route daily. 07/15/16   Epifanio Rolland Robin, MD  glucose blood [facility]) test strip Use as instructed 07/15/16   Epifanio Rolland Robin, MD  insulin  glargine (LANTUS ) 100 UNIT/ML Solostar Pen Inject 20 Units into the skin 2 (two) times daily. 08/05/23   Richad Jon CHRISTELLA, NP  Insulin  Pen Needle (NOVOFINE) 30G X 8 MM MISC Inject 10 each into the skin as needed. 08/04/23   Rolinda Rogue, MD  Lancet Devices [facility]) lancets Use as instructed 07/15/16   Epifanio Rolland Robin, MD  Lancets Misc. (ACCU-CHEK MULTICLIX LANCET DEV) KIT 1 Units by Does not apply route daily. 07/15/16   Epifanio Rolland Robin, MD  OZEMPIC, 0.25 OR 0.5 MG/DOSE, 2 MG/3ML SOPN Inject 0.75 mg into the skin once a week.    [provider]    Allergies: Patient has no  known allergies.    Review of Systems  Musculoskeletal:  Positive for arthralgias.    Updated Vital Signs BP 133/81 (BP Location: Right Arm)   Pulse 77   Temp 98.1 F (36.7 C) (Oral)   Resp 18   Ht 5' 4 (1.626 m)   Wt 68.9 kg   SpO2 100%   BMI 26.09 kg/m   Physical Exam Vitals and nursing note reviewed.  Constitutional:      Appearance: Normal appearance. She is not ill-appearing.  Eyes:     General: No scleral icterus. Pulmonary:     Effort: Pulmonary effort is normal. No respiratory distress.  Musculoskeletal:        General: No swelling, tenderness or deformity.     Comments: No obvious joint swelling, erythema, tenderness.  No evidence of effusion on physical exam.  Patient able to bend and flex her knee, but experiences some difficulty secondary to pain.  Skin:    Coloration: Skin is not jaundiced.  Neurological:     General: No focal deficit present.     Mental Status: She is alert.  Psychiatric:        Mood and Affect: Mood normal.     (all labs ordered are listed, but only abnormal results are displayed) Labs Reviewed  CBC WITH DIFFERENTIAL/PLATELET - Abnormal; Notable for the following components:      Result Value   Hemoglobin 11.9 (*)  MCV 74.8 (*)    MCH 24.2 (*)    All other components within normal limits  BASIC METABOLIC PANEL WITH GFR - Abnormal; Notable for the following components:   Glucose, Bld 219 (*)    All other components within normal limits  HCG, SERUM, QUALITATIVE    EKG: None  Radiology: DG Knee 2 Views Right Result Date: 12/25/2023 CLINICAL DATA:  Right knee pain. EXAM: RIGHT KNEE - 1-2 VIEW COMPARISON:  None Available. FINDINGS: No evidence of fracture, dislocation, or joint effusion. No evidence of arthropathy or other focal bone abnormality. Soft tissues are unremarkable. IMPRESSION: Negative. Electronically Signed   By: Vanetta Chou M.D.   On: 12/25/2023 17:38    Procedures   Medications Ordered in the ED - No  data to display                               Medical Decision Making Amount and/or Complexity of Data Reviewed Labs: ordered. Radiology: ordered.   This patient presents to the ED for concern of right knee pain, this involves an extensive number of treatment options, and is a complaint that carries with it a high risk of complications and morbidity.   Differential diagnosis includes: Septic joint, osteoarthritis, knee sprain, knee strain, gout  Co morbidities:  diabetes   Additional history: Patient was evaluated by her PCP on Friday for her general follow-up as well as the acute right knee pain.  She was advised to follow-up at the emergency department to obtain an x-ray and further workup.  Lab Tests:  I Ordered, and personally interpreted labs.  The pertinent results include: No acute abnormalities  Imaging Studies:  I ordered imaging studies including right knee x-ray I independently visualized and interpreted imaging which showed no evidence of fracture, dislocation or effusion I agree with the radiologist interpretation  Cardiac Monitoring/ECG:  The patient was maintained on a cardiac monitor.  I personally viewed and interpreted the cardiac monitored which showed an underlying rhythm of: Sinus rhythm  Medicines ordered and prescription drug management: Medication not indicated  Test Considered:   none  Critical Interventions:   none  Consultations Obtained: None  Problem List / ED Course:     ICD-10-CM   1. Acute pain of right knee  M25.561       MDM: 43 year old female who presents emergency department for evaluation of right knee pain.  Patient's pain has been present for the last 1 week and she was advised from her PCP who she saw on Friday to report to the emergency department for an x-ray and further evaluation.  There is no obvious swelling, erythema, warmth or tenderness to the joint.  Patient is able to bend and flex her knee with minimal  difficulty.  I suspect the difficulty with bending her knee is secondary to pain.  Knee x-ray shows no evidence of effusion, fracture, dislocation.  I also ordered basic labs due to patient's underlying diabetes and potential for gout.  Lab work is unremarkable.  No leukocytosis.  I suspect patient's pain is likely secondary to an acute muscle strain.  I have provided her with a referral to orthopedics.  I have also placed the patient in a knee immobilizer for her comfort while ambulating.  Patient has been educated on medications to take as needed for pain including Tylenol  and anti-inflammatories as indicated.  She has also been provided education on resting, icing, compressing and elevating  her leg.  Patient verbalized her understanding to all of this.  Patient's vital signs are stable.  Patient is appropriate for discharge at this time.   Dispostion:  After consideration of the diagnostic results and the patients response to treatment, I feel that the patient would benefit from supportive care and Ortho follow-up if symptoms worsen.    Final diagnoses:  Acute pain of right knee    ED Discharge Orders     None          Torrence Marry GORMAN DEVONNA 12/25/23 1819    Geraldene Hamilton, MD 12/25/23 2302

## 2023-12-25 NOTE — ED Triage Notes (Signed)
 Pt reports R knee pain x1 week. Pt denies any recent injury.

## 2023-12-25 NOTE — Discharge Instructions (Addendum)
 It was a pleasure taking care of you today. You were seen in the Emergency Department for right knee pain. Your work-up was reassuring. Your CT/Xray/Labs showed no acute abnormality to explain your symptoms.  I am giving you a referral to orthopedics, who can further evaluate your symptoms.  Please call them to schedule an appointment.  Their contact information is provided on your discharge paperwork.  Additionally, I have placed you in a knee immobilizer for your comfort.  Please wear this while you are walking around while symptoms persist.  You may remove the immobilizer to shower.  You may also take Tylenol  as needed for pain control.  I recommend being cautious of the dose of anti-inflammatories you take due to your underlying diabetes.  You may also use ice as needed. Refer to the attached documentation for further management of your symptoms.   Please return to the ER if you experience chest pain, trouble breathing, intractable nausea/vomiting or any other life threatening illnesses.
